# Patient Record
Sex: Male | Born: 1980 | Race: White | Hispanic: No | Marital: Married | Smoking: Former smoker
Health system: Southern US, Community
[De-identification: ages and names within clinical notes are randomized; demographics above are authoritative.]

## PROBLEM LIST (undated history)

## (undated) HISTORY — PX: MOUTH SURGERY: SHX715

---

## 2017-05-12 ENCOUNTER — Ambulatory Visit (INDEPENDENT_AMBULATORY_CARE_PROVIDER_SITE_OTHER): Admitting: Physician Assistant

## 2017-05-12 ENCOUNTER — Encounter: Payer: Self-pay | Admitting: Physician Assistant

## 2017-05-12 VITALS — BP 128/76 | HR 64 | Temp 98.7°F | Resp 16 | Ht 67.0 in | Wt 177.0 lb

## 2017-05-12 DIAGNOSIS — Z0000 Encounter for general adult medical examination without abnormal findings: Secondary | ICD-10-CM | POA: Diagnosis not present

## 2017-05-12 DIAGNOSIS — Z720 Tobacco use: Secondary | ICD-10-CM

## 2017-05-12 DIAGNOSIS — Z1329 Encounter for screening for other suspected endocrine disorder: Secondary | ICD-10-CM

## 2017-05-12 DIAGNOSIS — Z131 Encounter for screening for diabetes mellitus: Secondary | ICD-10-CM

## 2017-05-12 DIAGNOSIS — Z13220 Encounter for screening for lipoid disorders: Secondary | ICD-10-CM

## 2017-05-12 MED ORDER — BUPROPION HCL ER (SR) 150 MG PO TB12: ORAL_TABLET | ORAL | 0 refills | Status: DC

## 2017-05-12 NOTE — Patient Instructions (Signed)

## 2017-05-12 NOTE — Progress Notes (Signed)
Patient: Ryan Dudley Male    DOB: [birth year]   36 y.o.   MRN: [id number] Visit Date: 05/12/2017  Today's Provider: Trinna Post, PA-C   Chief Complaint  Patient presents with  . Establish Care   Subjective:    Ryan Dudley is a 36 y/o man presenting today to establish care. He was previously seen by [employer] as he is active duty in [employer]. 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   He was previously a Engineer, mining, met his wife who is also Engineer, mining in theatre in [place]. Wife currently works for [employer]. They have three children: Ryan Dudley, age 104 with achondroplasia; Ryan Dudley age 68, and the youngest boy, age 68.  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 He has also been deployed to [place]. He thinks he may have PTSD, but is able to cope with this. He reports a history of substance abuse. He has been participating in [place] since 2009 and has been sober since July 2017.  He is currently smoking a pack a day and also using dip. Had previously quit for four months in the past with Chantix, though it began to cause stomach pain and so he stopped. Ready to quit again. Has not had success with the patch in the past.  He is UTD on vaccinations.  Nicotine Dependence  Presents for initial visit. Preferred tobacco types include cigarettes. His urge triggers include company of smokers. Risk factors do not include contact with substance, decrease in perceived risk, disruptive behavior, drinking alcohol, drinking coffee, driving, meal time, perceived media message about smoking or stress.He smokes 1 pack of cigarettes per day. Sigmund is ready to quit.       Allergies  Allergen Reactions  . Ceclor [Cefaclor]      Current Outpatient Prescriptions:  .  AFLURIA QUADRIVALENT 0.5 ML injection, ADM 0.5ML IM  UTD, Disp: , Rfl: 0 .  buPROPion (WELLBUTRIN SR) 150 MG 12 hr tablet, Take on pill daily for three days. On day four, take one pill twice daily for remained of treatment., Disp: 180 tablet, Rfl: 0  Review of Systems  Constitutional: Negative.   HENT: Negative.   Eyes: Negative.   Respiratory: Negative.   Cardiovascular: Negative.   Gastrointestinal: Negative.   Endocrine: Negative.   Genitourinary: Negative.   Musculoskeletal: Negative.   Skin: Positive for rash. Negative for color change, pallor and wound.  Allergic/Immunologic: Positive for environmental allergies.  Neurological: Negative.   Hematological: Negative.   Psychiatric/Behavioral: Negative.    Family History  Problem Relation Age of Onset  . Adopted: Yes  . Pneumonia Mother    Past Surgical History:  Procedure Laterality Date  . MOUTH SURGERY      Social History  Substance Use Topics  . Smoking status: Current Every Day Smoker    Packs/day: 0.75    Types: Cigarettes  . Smokeless tobacco: Current User  . Alcohol use No   Objective:   BP 128/76 (BP Location: Left Arm, Patient Position: Sitting, Cuff Size: Large)   Pulse 64   Temp 98.7 F (37.1 C) (Oral)   Resp 16   Ht '5\' 7"'$  (1.702 m)   Wt 177 lb (80.3 kg)   BMI 27.72 kg/m  Vitals:   05/12/17 0913  BP: 128/76  Pulse: 64  Resp: 16  Temp: 98.7 F (37.1 C)  TempSrc: Oral  Weight: 177 lb (80.3 kg)  Height: '5\' 7"'$  (1.702 m)     Physical Exam  Constitutional: He is oriented to person, place, and time. He appears well-developed and well-nourished.  HENT:  Right Ear: Tympanic membrane and external ear normal.  Left Ear: Tympanic membrane and external ear normal.  Mouth/Throat: Oropharynx is clear and moist. No oral lesions. No oropharyngeal exudate.  Eyes: Conjunctivae are normal.  Neck: Neck supple.  Cardiovascular: Normal rate and regular rhythm.   Pulmonary/Chest: Effort normal and breath sounds normal.  Abdominal: Soft. Bowel sounds are  normal.  Lymphadenopathy:    He has no cervical adenopathy.  Neurological: He is alert and oriented to person, place, and time.  Skin: Skin is warm and dry.  Psychiatric: He has a normal mood and affect. His behavior is normal.   Depression screen [facility] 2/9 05/12/2017  Decreased Interest 0  Down, Depressed, Hopeless 0  PHQ - 2 Score 0  Altered sleeping 0  Tired, decreased energy 0  Change in appetite 0  Feeling bad or failure about yourself  0  Trouble concentrating 0  Moving slowly or fidgety/restless 0  Suicidal thoughts 0  PHQ-9 Score 0  Difficult doing work/chores Not difficult at all        Assessment & Plan:     1. Annual physical exam  - CBC with Differential  2. Tobacco abuse  - buPROPion (WELLBUTRIN SR) 150 MG 12 hr tablet; Take on pill daily for three days. On day four, take one pill twice daily for remained of treatment.  Dispense: 180 tablet; Refill: 0  3. Thyroid disorder  - TSH  4. Screening cholesterol level  - Lipid Profile  5. Diabetes mellitus screening  - Comprehensive Metabolic Panel (CMET)  Return in about 1 year (around 05/12/2018).  The entirety of the information documented in the History of Present Illness, Review of Systems and Physical Exam were personally obtained by me. Portions of this information were initially documented by Ashley Royalty, CMA and reviewed by me for thoroughness and accuracy.        Trinna Post, PA-C  [facility]

## 2017-07-05 ENCOUNTER — Ambulatory Visit (INDEPENDENT_AMBULATORY_CARE_PROVIDER_SITE_OTHER): Admitting: Physician Assistant

## 2017-07-05 ENCOUNTER — Encounter: Payer: Self-pay | Admitting: Physician Assistant

## 2017-07-05 ENCOUNTER — Telehealth: Payer: Self-pay | Admitting: Physician Assistant

## 2017-07-05 VITALS — BP 128/84 | HR 64 | Temp 98.4°F | Resp 16 | Wt 173.0 lb

## 2017-07-05 DIAGNOSIS — J40 Bronchitis, not specified as acute or chronic: Secondary | ICD-10-CM

## 2017-07-05 DIAGNOSIS — L858 Other specified epidermal thickening: Secondary | ICD-10-CM

## 2017-07-05 DIAGNOSIS — Z720 Tobacco use: Secondary | ICD-10-CM | POA: Diagnosis not present

## 2017-07-05 LAB — CBC WITH DIFFERENTIAL/PLATELET
Basophils Absolute: 39 cells/uL (ref 0–200)
Basophils Relative: 0.7 %
Eosinophils Absolute: 171 cells/uL (ref 15–500)
Eosinophils Relative: 3.1 %
HCT: 44.4 % (ref 38.5–50.0)
Hemoglobin: 15.5 g/dL (ref 13.2–17.1)
Lymphs Abs: 1925 cells/uL (ref 850–3900)
MCH: 31.1 pg (ref 27.0–33.0)
MCHC: 34.9 g/dL (ref 32.0–36.0)
MCV: 89.2 fL (ref 80.0–100.0)
MPV: 9.1 fL (ref 7.5–12.5)
Monocytes Relative: 11.7 %
Neutro Abs: 2723 cells/uL (ref 1500–7800)
Neutrophils Relative %: 49.5 %
Platelets: 256 10*3/uL (ref 140–400)
RBC: 4.98 10*6/uL (ref 4.20–5.80)
RDW: 12.6 % (ref 11.0–15.0)
Total Lymphocyte: 35 %
WBC mixed population: 644 cells/uL (ref 200–950)
WBC: 5.5 10*3/uL (ref 3.8–10.8)

## 2017-07-05 LAB — COMPLETE METABOLIC PANEL WITH GFR
AG Ratio: 2 (calc) (ref 1.0–2.5)
ALT: 22 U/L (ref 9–46)
AST: 23 U/L (ref 10–40)
Albumin: 4.3 g/dL (ref 3.6–5.1)
Alkaline phosphatase (APISO): 64 U/L (ref 40–115)
BUN: 17 mg/dL (ref 7–25)
CO2: 30 mmol/L (ref 20–32)
Calcium: 9.5 mg/dL (ref 8.6–10.3)
Chloride: 103 mmol/L (ref 98–110)
Creat: 1.02 mg/dL (ref 0.60–1.35)
GFR, Est African American: 111 mL/min/{1.73_m2} (ref >=60)
GFR, Est Non African American: 95 mL/min/{1.73_m2} (ref >=60)
Globulin: 2.1 g/dL (calc) (ref 1.9–3.7)
Glucose, Bld: 90 mg/dL (ref 65–99)
Potassium: 4 mmol/L (ref 3.5–5.3)
Sodium: 140 mmol/L (ref 135–146)
Total Bilirubin: 0.9 mg/dL (ref 0.2–1.2)
Total Protein: 6.4 g/dL (ref 6.1–8.1)

## 2017-07-05 LAB — TSH: TSH: 1.59 mIU/L (ref 0.40–4.50)

## 2017-07-05 LAB — LIPID PANEL
Cholesterol: 146 mg/dL (ref <200)
HDL: 51 mg/dL (ref >40)
LDL Cholesterol (Calc): 76 mg/dL (calc)
Non-HDL Cholesterol (Calc): 95 mg/dL (calc) (ref <130)
Total CHOL/HDL Ratio: 2.9 (calc) (ref <5.0)
Triglycerides: 104 mg/dL (ref <150)

## 2017-07-05 MED ORDER — VARENICLINE TARTRATE 1 MG PO TABS: 1.0000 mg | ORAL_TABLET | Freq: Two times a day (BID) | ORAL | 1 refills | Status: AC

## 2017-07-05 MED ORDER — VARENICLINE TARTRATE 0.5 MG X 11 & 1 MG X 42 PO MISC: ORAL | 0 refills | Status: DC

## 2017-07-05 NOTE — Progress Notes (Signed)
Patient: Ryan Dudley Male    DOB: [birth year]   36 y.o.   MRN: [id number] Visit Date: 07/05/2017  Today's Provider: Trey SailorsAdriana M Pollak, PA-C   Chief Complaint  Patient presents with  . Hospitalization Follow-up  . Bronchitis   Subjective:  Ryan MedicoDavid Dudley is a 36 y/o man who currently spokes 3/4 packs per day who is following up for recent hospitalization for bronchitis. He was traveling in [place] for his work in [employer] when he became very ill during his training. He says he presented to the ER where his lactic acid was elevated. He reports he had blood cultures drawn which grew staph, but he says this was just a contaminant. He reports he had IV medications that might have been vancomycin. He reports his CXR was negative for pneumonia.  He was discharged per his report on Cefdinir 300 mg BID x 3 days. He has taken Cefdinir 300 mg QD x 3 days. He reports feeling better. He reports somewhat of a lingering cough. Denies fevers, chills, nausea, vomiting, confusion. He does not bring any records with him today.  He also reports that he tried Wellbutrin for smoking cessation, which did not work. He would like to try Chantix again.  Also reporting small bumps on arms. He has tried moisturizing and salicylic acid wash with some but not complete improvement.      Follow up Hospitalization  Patient was admitted to  on 06/28/2017 and discharged on 06/30/2017. He was treated for Bronchitis and Staph Infection. Treatment for this included IV Antibiotics and Cefdinir. He reports excellent compliance with treatment. He reports this condition is Improved.  ------------------------------------------------------------------------------------     Cough  This is a new problem. The problem has been rapidly improving. Pertinent negatives include no chest pain, chills, ear congestion, ear pain, fever, headaches, heartburn, hemoptysis, myalgias, nasal congestion, postnasal drip, rash,  rhinorrhea, sore throat, shortness of breath, sweats, weight loss or wheezing.       Allergies  Allergen Reactions  . Ceclor [Cefaclor]      Current Outpatient Medications:  .  AFLURIA QUADRIVALENT 0.5 ML injection, ADM 0.5ML IM UTD, Disp: , Rfl: 0 .  buPROPion (WELLBUTRIN SR) 150 MG 12 hr tablet, Take on pill daily for three days. On day four, take one pill twice daily for remained of treatment. (Patient not taking: Reported on 07/05/2017), Disp: 180 tablet, Rfl: 0 .  cefdinir (OMNICEF) 300 MG capsule, TK ONE C PO  Q 12 H., Disp: , Rfl: 0  Review of Systems  Constitutional: Positive for fatigue. Negative for activity change, appetite change, chills, diaphoresis, fever, unexpected weight change and weight loss.  HENT: Negative.  Negative for ear pain, postnasal drip, rhinorrhea and sore throat.   Respiratory: Positive for cough. Negative for apnea, hemoptysis, choking, chest tightness, shortness of breath, wheezing and stridor.   Cardiovascular: Negative.  Negative for chest pain.  Gastrointestinal: Negative for heartburn.  Musculoskeletal: Negative for myalgias.  Skin: Negative for rash.  Neurological: Negative for dizziness, light-headedness and headaches.  Psychiatric/Behavioral: The patient is nervous/anxious.     Social History   Tobacco Use  . Smoking status: Current Every Day Smoker    Packs/day: 0.75    Types: Cigarettes  . Smokeless tobacco: Current User  Substance Use Topics  . Alcohol use: No   Objective:   BP 128/84 (BP Location: Left Arm, Patient Position: Sitting, Cuff Size: Normal)   Pulse 64   Temp 98.4 F (36.9 C) (  Oral)   Resp 16   Wt 173 lb (78.5 kg)   SpO2 95%   BMI 27.10 kg/m  Vitals:   07/05/17 0813  BP: 128/84  Pulse: 64  Resp: 16  Temp: 98.4 F (36.9 C)  TempSrc: Oral  SpO2: 95%  Weight: 173 lb (78.5 kg)     Physical Exam  Constitutional: He is oriented to person, place, and time. He appears well-developed and well-nourished.    HENT:  Right Ear: External ear normal.  Left Ear: External ear normal.  Mouth/Throat: Oropharynx is clear and moist. No oropharyngeal exudate.  Eyes: Conjunctivae are normal.  Cardiovascular: Normal rate and regular rhythm.  Pulmonary/Chest: Effort normal and breath sounds normal.  Neurological: He is alert and oriented to person, place, and time.  Skin: Skin is warm and dry.  Small pink papules scattered on posterior upper arms.  Psychiatric: He has a normal mood and affect. His behavior is normal.        Assessment & Plan:     1. Bronchitis  Do not have hospital records. I will request these. Does not sound like he had bacteremia as it may have been skin contaminant and his IV abx were discontinued. Did not take cefdinir as prescribed, however, he feels better. Exam benign today. Will get labwork ordered last visit.  2. Keratosis pilaris  - Ambulatory referral to Dermatology  3. Tobacco abuse  I have spent at least 3 minutes counseling patient on smoking cessation. We will stop Wellbutrin and start Chantix.  - varenicline (CHANTIX STARTING MONTH PAK) 0.5 MG X 11 & 1 MG X 42 tablet; One 0.5 mg tablet 1x daily for 3 days, increase to one 0.5 mg tablet 2x daily for 4 day, increase to one 1 mg tablet 2x daily.  Dispense: 53 tablet; Refill: 0 - varenicline (CHANTIX CONTINUING MONTH PAK) 1 MG tablet; Take 1 tablet (1 mg total) by mouth 2 (two) times daily.  Dispense: 60 tablet; Refill: 1  Return in about 1 year (around 07/05/2018) for CPE.  The entirety of the information documented in the History of Present Illness, Review of Systems and Physical Exam were personally obtained by me. Portions of this information were initially documented by Kavin LeechLaura Walsh, CMA and reviewed by me for thoroughness and accuracy.        Trey SailorsAdriana M Pollak, PA-C  [facility]

## 2017-07-05 NOTE — Telephone Encounter (Signed)
We can't fax private info (social security #) to unsecured fax, would need patient to sign ROI. Does he have a name of the mail order company? We may be able to send the prescription online without his social if he provides that.

## 2017-07-05 NOTE — Patient Instructions (Signed)

## 2017-07-05 NOTE — Telephone Encounter (Signed)
Pt is requesting a new Rx written and faxed to mail order at fax #[phone] so pt can get it at a lower cost.  Pt states to include Social Security #[id number].  varenicline (CHANTIX STARTING MONTH PAK) 0.5 MG X 11 & 1 MG X 42 tablet   varenicline (CHANTIX CONTINUING MONTH PAK) 1 MG tablet

## 2017-07-07 ENCOUNTER — Telehealth: Payer: Self-pay

## 2017-07-07 NOTE — Telephone Encounter (Signed)
LMTCB 07/07/2017  Thanks,   -Adylee Leonardo  

## 2017-07-07 NOTE — Telephone Encounter (Signed)
-----   Message from Trey SailorsAdriana M Pollak, [place]PA-C sent at 07/06/2017  8:41 AM EST ----- Cholesterol normal, TSH normal. CBC with no signs of infection or anemia. CMET with normal liver and kidney function, normal electrolytes.

## 2017-07-07 NOTE — Telephone Encounter (Signed)
Advised  ED 

## 2017-07-07 NOTE — Telephone Encounter (Signed)
Advised  He will be in touch with us on how he wants this handled.  ED

## 2017-07-07 NOTE — Telephone Encounter (Signed)
LMTCB 07/07/2017  Thanks,   -Matraca Hunkins  

## 2017-08-03 ENCOUNTER — Telehealth: Payer: Self-pay

## 2017-08-03 NOTE — Telephone Encounter (Signed)
Noted, thank you

## 2017-08-03 NOTE — Telephone Encounter (Signed)
Patient report that when he runs he feels like his heart skips a beat. He feels it more when he takes deep breaths.Reports this has happened before and but now is happening more with exercising and running. Feels that is keeping him from getting to where he needs to get with the training he needs to keep up with. [place] [employer] [employer] [place]. Reports that since he notice the heart palpitations for the past months he stop "smoking" and he is down to two caffeine drinks, He reports that he did drink a monster energy drink yesterday. Reports No SOB or chest pressure/pain. No palpitations when at rest. Patient requested an appointment for January 30th. Patient was scheduled 08/17/17 at 8:00  Thanks,  -Joseline

## 2017-08-17 ENCOUNTER — Ambulatory Visit (INDEPENDENT_AMBULATORY_CARE_PROVIDER_SITE_OTHER): Admitting: Physician Assistant

## 2017-08-17 ENCOUNTER — Encounter: Payer: Self-pay | Admitting: Physician Assistant

## 2017-08-17 VITALS — BP 122/78 | HR 68 | Temp 98.4°F | Resp 16 | Wt 170.0 lb

## 2017-08-17 DIAGNOSIS — R002 Palpitations: Secondary | ICD-10-CM

## 2017-08-17 NOTE — Patient Instructions (Signed)
Palpitations A palpitation is the feeling that your heart:  Has an uneven (irregular) heartbeat.  Is beating faster than normal.  Is fluttering.  Is skipping a beat.  This is usually not a serious problem. In some cases, you may need more medical tests. Follow these instructions at home:  Avoid: ? Caffeine in coffee, tea, soft drinks, diet pills, and energy drinks. ? Chocolate. ? Alcohol.  Do not use any tobacco products. These include cigarettes, chewing tobacco, and e-cigarettes. If you need help quitting, ask your doctor.  Try to reduce your stress. These things may help: ? Yoga. ? Meditation. ? Physical activity. Swimming, jogging, and walking are good choices. ? A method that helps you use your mind to control things in your body, like heartbeats (biofeedback).  Get plenty of rest and sleep.  Take over-the-counter and prescription medicines only as told by your doctor.  Keep all follow-up visits as told by your doctor. This is important. Contact a doctor if:  Your heartbeat is still fast or uneven after 24 hours.  Your palpitations occur more often. Get help right away if:  You have chest pain.  You feel short of breath.  You have a very bad headache.  You feel dizzy.  You pass out (faint). This information is not intended to replace advice given to you by your health care provider. Make sure you discuss any questions you have with your health care provider. Document Released: 04/13/2008 Document Revised: 12/11/2015 Document Reviewed: 03/20/2015 Elsevier Interactive Patient Education  2018 Elsevier Inc.  

## 2017-08-17 NOTE — Progress Notes (Signed)
Patient: Ryan Dudley Male    DOB: [birth year]   37 y.o.   MRN: [id number] Visit Date: 08/17/2017  Today's Provider: Trey Sailors, PA-C   Chief Complaint  Patient presents with  . Palpitations   Subjective:    Ryan Dudley is a 37 y/o man with PMH significant for history of smoking, using dip who presents today for several months of palpitations. He reports he has had several episodes in the past but they have been more frequent recently. He was on treadmill running, exeprienced heart skipping a beat on the treadmill. He reduced speed at this time, tried to relax and symptoms reduced. Symptoms returned with increase in speed. No sharp chest pains, has happened before but previously attributed this to caffeine.  cutting caffeine to 1-2 cups coffee daily has reduced caffeine intake from energy drinks. Happened again when running up stairs. Does not know much about his family history. Denies anxiety.    Palpitations   This is a new problem. The problem has been unchanged (Only with running, climbing stairs. ). The symptoms are aggravated by exercise. Associated symptoms include an irregular heartbeat. Pertinent negatives include no anxiety, chest fullness, chest pain, coughing, diaphoresis, dizziness, fever, malaise/fatigue, nausea, near-syncope, numbness, shortness of breath, syncope, vomiting or weakness.       Allergies  Allergen Reactions  . Ceclor [Cefaclor]      Current Outpatient Medications:  .  varenicline (CHANTIX CONTINUING MONTH PAK) 1 MG tablet, Take 1 tablet (1 mg total) by mouth 2 (two) times daily., Disp: 60 tablet, Rfl: 1 .  AFLURIA QUADRIVALENT 0.5 ML injection, ADM 0.5ML IM UTD, Disp: , Rfl: 0 .  cefdinir (OMNICEF) 300 MG capsule, TK ONE C PO  Q 12 H., Disp: , Rfl: 0  Review of Systems  Constitutional: Negative.  Negative for diaphoresis, fever and malaise/fatigue.  Respiratory: Negative.  Negative for cough and shortness of breath.   Cardiovascular:  Positive for palpitations. Negative for chest pain, leg swelling, syncope and near-syncope.  Gastrointestinal: Negative.  Negative for nausea and vomiting.  Neurological: Negative for dizziness, weakness, numbness and headaches.  Psychiatric/Behavioral: The patient is not nervous/anxious.     Social History   Tobacco Use  . Smoking status: Former Smoker    Packs/day: 0.75    Types: Cigarettes    Last attempt to quit: 05/19/2017    Years since quitting: 0.2  . Smokeless tobacco: Current User  Substance Use Topics  . Alcohol use: No   Objective:   BP 122/78 (BP Location: Right Arm, Patient Position: Sitting, Cuff Size: Normal)   Pulse 68   Temp 98.4 F (36.9 C) (Oral)   Resp 16   Wt 170 lb (77.1 kg)   BMI 26.63 kg/m  Vitals:   08/17/17 0809  BP: 122/78  Pulse: 68  Resp: 16  Temp: 98.4 F (36.9 C)  TempSrc: Oral  Weight: 170 lb (77.1 kg)     Physical Exam  Constitutional: He is oriented to person, place, and time. He appears well-developed and well-nourished.  Neck: Normal carotid pulses present. Carotid bruit is not present.  Cardiovascular: Normal rate, regular rhythm and normal heart sounds.  Pulmonary/Chest: Effort normal and breath sounds normal.  Neurological: He is alert and oriented to person, place, and time.  Skin: Skin is warm and dry.  Psychiatric: He has a normal mood and affect. His behavior is normal.        Assessment & Plan:  1. Heart palpitations  Have been happening for a while, noticeable during exertion. Former smoker, still uses dip. Former history of alcohol abuse, currently in remission. Since events are very sporadic, I do not think we would capture them on an EKG in office. For further evaluation and reassurance, recommend cardiology referral for event monitor.  - Ambulatory referral to Cardiology  The entirety of the information documented in the History of Present Illness, Review of Systems and Physical Exam were personally obtained  by me. Portions of this information were initially documented by Kavin LeechLaura Walsh, CMA and reviewed by me for thoroughness and accuracy.   Return if symptoms worsen or fail to improve.  I have spent 15 minutes with this patient, >50% of which was spent on counseling and coordination of care.      Trey SailorsAdriana M Pollak, PA-C  [facility]

## 2017-08-19 DIAGNOSIS — R002 Palpitations: Secondary | ICD-10-CM | POA: Insufficient documentation

## 2017-08-19 DIAGNOSIS — R001 Bradycardia, unspecified: Secondary | ICD-10-CM | POA: Insufficient documentation

## 2017-10-26 ENCOUNTER — Telehealth: Payer: Self-pay | Admitting: Physician Assistant

## 2017-10-26 NOTE — Telephone Encounter (Signed)
Faxed [facility] on 12.19.18

## 2017-11-01 ENCOUNTER — Other Ambulatory Visit: Payer: Self-pay | Admitting: Physician Assistant

## 2017-11-01 DIAGNOSIS — Z720 Tobacco use: Secondary | ICD-10-CM

## 2017-11-01 MED ORDER — BUPROPION HCL ER (SR) 150 MG PO TB12: ORAL_TABLET | ORAL | 1 refills | Status: DC

## 2017-11-01 NOTE — Telephone Encounter (Signed)
Pt contacted office for refill request on the following medications:  buPROPion (WELLBUTRIN SR) 150 MG 12 hr tablet  [facility] [address]  Last Rx: 05/12/17 LOV: 08/17/17 Please advise. Thanks TNP

## 2017-11-01 NOTE — Telephone Encounter (Signed)
Wellbutrin sent.

## 2017-12-14 ENCOUNTER — Other Ambulatory Visit: Payer: Self-pay | Admitting: Physician Assistant

## 2017-12-14 DIAGNOSIS — Z720 Tobacco use: Secondary | ICD-10-CM

## 2018-03-07 ENCOUNTER — Ambulatory Visit (INDEPENDENT_AMBULATORY_CARE_PROVIDER_SITE_OTHER): Admitting: Family Medicine

## 2018-03-07 ENCOUNTER — Encounter: Payer: Self-pay | Admitting: Family Medicine

## 2018-03-07 VITALS — BP 130/88 | HR 70 | Temp 98.3°F | Resp 16 | Wt 183.0 lb

## 2018-03-07 DIAGNOSIS — J069 Acute upper respiratory infection, unspecified: Secondary | ICD-10-CM | POA: Diagnosis not present

## 2018-03-07 MED ORDER — FLUTICASONE PROPIONATE 50 MCG/ACT NA SUSP: 2.0000 | Freq: Every day | NASAL | 6 refills | Status: DC

## 2018-03-07 NOTE — Patient Instructions (Signed)
Upper Respiratory Infection, Adult Most upper respiratory infections (URIs) are caused by a virus. A URI affects the nose, throat, and upper air passages. The most common type of URI is often called "the common cold." Follow these instructions at home:  Take medicines only as told by your doctor.  Gargle warm saltwater or take cough drops to comfort your throat as told by your doctor.  Use a warm mist humidifier or inhale steam from a shower to increase air moisture. This may make it easier to breathe.  Drink enough fluid to keep your pee (urine) clear or pale yellow.  Eat soups and other clear broths.  Have a healthy diet.  Rest as needed.  Go back to work when your fever is gone or your doctor says it is okay. ? You may need to stay home longer to avoid giving your URI to others. ? You can also wear a face mask and wash your hands often to prevent spread of the virus.  Use your inhaler more if you have asthma.  Do not use any tobacco products, including cigarettes, chewing tobacco, or electronic cigarettes. If you need help quitting, ask your doctor. Contact a doctor if:  You are getting worse, not better.  Your symptoms are not helped by medicine.  You have chills.  You are getting more short of breath.  You have brown or red mucus.  You have yellow or brown discharge from your nose.  You have pain in your face, especially when you bend forward.  You have a fever.  You have puffy (swollen) neck glands.  You have pain while swallowing.  You have white areas in the back of your throat. Get help right away if:  You have very bad or constant: ? Headache. ? Ear pain. ? Pain in your forehead, behind your eyes, and over your cheekbones (sinus pain). ? Chest pain.  You have long-lasting (chronic) lung disease and any of the following: ? Wheezing. ? Long-lasting cough. ? Coughing up blood. ? A change in your usual mucus.  You have a stiff neck.  You have  changes in your: ? Vision. ? Hearing. ? Thinking. ? Mood. This information is not intended to replace advice given to you by your health care provider. Make sure you discuss any questions you have with your health care provider. Document Released: 12/22/2007 Document Revised: 03/07/2016 Document Reviewed: 10/10/2013 Elsevier Interactive Patient Education  2018 Elsevier Inc.  

## 2018-03-07 NOTE — Progress Notes (Signed)
Patient: Ryan MedicoDavid Troiani Male    DOB: June 17, 1981   37 y.o.   MRN: [id number] Visit Date: 03/07/2018  Today's Provider: Shirlee LatchAngela Bacigalupo, MD   I, Joslyn HyEmily Ratchford, CMA, am acting as scribe for Shirlee LatchAngela Bacigalupo, MD.  Chief Complaint  Patient presents with  . URI   Subjective:    URI   This is a new problem. The current episode started in the past 7 days. The problem has been gradually worsening. There has been no fever. Associated symptoms include chest pain (from coughing), congestion, coughing (somewhat productive), headaches, neck pain, a plugged ear sensation, rhinorrhea, sinus pain, sneezing and a sore throat (in the mornings). Pertinent negatives include no abdominal pain, diarrhea, dysuria, ear pain, nausea, swollen glands, vomiting or wheezing. He has tried antihistamine, decongestant and sleep for the symptoms. The treatment provided no relief (sleep helps more than OTC medications).   5 days duration.  Feels like an allergy attack.  Tried Zyrtec, Claritin, decongestant and honey for cough.      Allergies  Allergen Reactions  . Ceclor [Cefaclor]     States he was administered this while hospitalized without reaction.     Current Outpatient Medications:  .  buPROPion (WELLBUTRIN SR) 150 MG 12 hr tablet, TAKE 1 TABLET BY MOUTH DAILY FOR 3 DAYS, THEN INCREASE TO 1 TABLET TWICE DAILY AS DIRECTED, Disp: 180 tablet, Rfl: 1  Review of Systems  HENT: Positive for congestion, rhinorrhea, sinus pain, sneezing and sore throat (in the mornings). Negative for ear pain.   Respiratory: Positive for cough (somewhat productive). Negative for wheezing.   Cardiovascular: Positive for chest pain (from coughing).  Gastrointestinal: Negative for abdominal pain, diarrhea, nausea and vomiting.  Genitourinary: Negative for dysuria.  Musculoskeletal: Positive for neck pain.  Neurological: Positive for headaches.    Social History   Tobacco Use  . Smoking status: Former Smoker   Packs/day: 0.75    Types: Cigarettes    Last attempt to quit: 05/19/2017    Years since quitting: 0.8  . Smokeless tobacco: Former Engineer, waterUser  Substance Use Topics  . Alcohol use: No   Objective:   BP 130/88 (BP Location: Left Arm, Patient Position: Sitting, Cuff Size: Normal)   Pulse 70   Temp 98.3 F (36.8 C) (Oral)   Resp 16   Wt 183 lb (83 kg)   SpO2 98%   BMI 28.66 kg/m  Vitals:   03/07/18 0844  BP: 130/88  Pulse: 70  Resp: 16  Temp: 98.3 F (36.8 C)  TempSrc: Oral  SpO2: 98%  Weight: 183 lb (83 kg)     Physical Exam  Constitutional: He is oriented to person, place, and time. He appears well-developed and well-nourished. No distress.  HENT:  Head: Normocephalic and atraumatic.  Right Ear: Tympanic membrane, external ear and ear canal normal.  Left Ear: Tympanic membrane, external ear and ear canal normal.  Nose: Mucosal edema and rhinorrhea present. Right sinus exhibits no maxillary sinus tenderness and no frontal sinus tenderness. Left sinus exhibits no maxillary sinus tenderness and no frontal sinus tenderness.  Mouth/Throat: Oropharynx is clear and moist. No oropharyngeal exudate.  Eyes: Pupils are equal, round, and reactive to light. Conjunctivae are normal. No scleral icterus.  Neck: Neck supple. No thyromegaly present.  Cardiovascular: Normal rate, regular rhythm, normal heart sounds and intact distal pulses.  No murmur heard. Pulmonary/Chest: Effort normal and breath sounds normal. No respiratory distress. He has no wheezes. He has no rales.  Abdominal:  Soft. He exhibits no distension. There is no tenderness.  Musculoskeletal: He exhibits no edema.  Lymphadenopathy:    He has no cervical adenopathy.  Neurological: He is alert and oriented to person, place, and time.  Skin: Skin is warm and dry. Capillary refill takes less than 2 seconds. No rash noted.  Psychiatric: He has a normal mood and affect. His behavior is normal.  Vitals reviewed.       Assessment  & Plan:   1. Viral URI - symptoms and exam c/w viral URI - no evidence of strep pharyngitis, CAP, AOM, bacterial sinusitis, or other bacterial infection - discussed symptomatic management, natural course, and return precautions   Meds ordered this encounter  Medications  . fluticasone (FLONASE) 50 MCG/ACT nasal spray    Sig: Place 2 sprays into both nostrils daily.    Dispense:  16 g    Refill:  6     Return if symptoms worsen or fail to improve.   The entirety of the information documented in the History of Present Illness, Review of Systems and Physical Exam were personally obtained by me. Portions of this information were initially documented by Irving BurtonEmily Ratchford, CMA and reviewed by me for thoroughness and accuracy.    Erasmo DownerBacigalupo, Angela M, MD, MPH [facility] 03/07/2018 9:06 AM

## 2018-04-03 ENCOUNTER — Ambulatory Visit (INDEPENDENT_AMBULATORY_CARE_PROVIDER_SITE_OTHER): Admitting: Family Medicine

## 2018-04-03 ENCOUNTER — Encounter: Payer: Self-pay | Admitting: Family Medicine

## 2018-04-03 ENCOUNTER — Ambulatory Visit
Admission: RE | Admit: 2018-04-03 | Discharge: 2018-04-03 | Disposition: A | Discharge status: Home / Self Care | Source: Ambulatory Visit | Attending: Family Medicine | Admitting: Family Medicine

## 2018-04-03 VITALS — BP 128/70 | HR 56 | Temp 98.6°F | Resp 16 | Wt 184.0 lb

## 2018-04-03 DIAGNOSIS — M545 Low back pain, unspecified: Secondary | ICD-10-CM

## 2018-04-03 MED ORDER — MELOXICAM 15 MG PO TABS: 15.0000 mg | ORAL_TABLET | Freq: Every day | ORAL | 0 refills | Status: DC

## 2018-04-03 NOTE — Progress Notes (Signed)
  Subjective:     Patient ID: Ryan MedicoDavid Dudley, male   DOB: 12/21/1980, 37 y.o.   MRN: [id number] Chief Complaint  Patient presents with  . Back Pain    Patient comes in office today with concerns of lower back pain for one month. Patient is on active duty and states that he works 3-5 days a week and states that he has pain when stretching, sitting for prolonged periods or when exercising.    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 He works out near daily with aerobic exercise and weights both machine and free. Reports situps tend to make his back hurt more. No radicular sx or prior hx of injury or surgery. States he is pending physical training.  Review of Systems     Objective:   Physical Exam  Constitutional: He appears well-developed and well-nourished. No distress.  Musculoskeletal:  Muscle strength in lower extremities 5/5. SLR's to 90 degrees without radiation of back pain. Localizes to his lower lumbar spine. Non-tender to palpation.       Assessment:    1. Midline low back pain without sciatica, unspecified chronicity: start meloxicam - DG Lumbar Spine Complete; Future    Plan:    Further f/u pending x-ray report.

## 2018-04-03 NOTE — Patient Instructions (Signed)
We will call you with the x-ray results. Consider cross training to avoid activity that increased stress on your back.

## 2018-05-08 ENCOUNTER — Ambulatory Visit (INDEPENDENT_AMBULATORY_CARE_PROVIDER_SITE_OTHER): Admitting: Physician Assistant

## 2018-05-08 ENCOUNTER — Encounter: Payer: Self-pay | Admitting: Physician Assistant

## 2018-05-08 VITALS — BP 134/92 | HR 80 | Temp 97.8°F | Resp 18 | Wt 182.2 lb

## 2018-05-08 DIAGNOSIS — F419 Anxiety disorder, unspecified: Secondary | ICD-10-CM | POA: Diagnosis not present

## 2018-05-08 DIAGNOSIS — M545 Low back pain, unspecified: Secondary | ICD-10-CM

## 2018-05-08 DIAGNOSIS — G8929 Other chronic pain: Secondary | ICD-10-CM

## 2018-05-08 NOTE — Patient Instructions (Signed)

## 2018-05-08 NOTE — Progress Notes (Signed)
Patient: Ryan Dudley Male    DOB: 05/12/1981   37 y.o.   MRN: [id number] Visit Date: 05/10/2018  Today's Provider: Trey Sailors, PA-C   Chief Complaint  Patient presents with  . Back Pain   Subjective:    HPI Back Pain  Patient presents today with lower back pain. Patient has been experiencing the pain for about 2-3 months now. Pain denies any other symptoms at this time. Was seen in this clinic 1 month ago by another provider and prescribed Meloxicam. Patient has been taking the Meloxicam and states it has not helped out any. Lumbar xray shows minimal L5-S1 disc space narrowing but otherwise normal. Reports it hurts in his low back, increases with bending over and sitting. Denies issues walking, weakness in legs, incontinence. Denies pain radiating down his legs. He drives 1.5 hours one way to [place] [facility] [facility] for work. Has decreased exercise recently.   Allergies  Allergen Reactions  . Ceclor [Cefaclor]     States he was administered this while hospitalized without reaction.     Current Outpatient Medications:  .  buPROPion (WELLBUTRIN SR) 150 MG 12 hr tablet, TAKE 1 TABLET BY MOUTH DAILY FOR 3 DAYS, THEN INCREASE TO 1 TABLET TWICE DAILY AS DIRECTED, Disp: 180 tablet, Rfl: 1 .  fluticasone (FLONASE) 50 MCG/ACT nasal spray, Place 2 sprays into both nostrils daily., Disp: 16 g, Rfl: 6 .  meloxicam (MOBIC) 15 MG tablet, Take 1 tablet (15 mg total) by mouth daily., Disp: 30 tablet, Rfl: 0  Review of Systems  Constitutional: Negative.   HENT: Negative.   Respiratory: Negative.   Cardiovascular: Negative.   Gastrointestinal: Negative.   Endocrine: Negative.   Musculoskeletal: Positive for back pain.  Neurological: Negative.     Social History   Tobacco Use  . Smoking status: Former Smoker    Packs/day: 0.75    Types: Cigarettes    Last attempt to quit: 05/19/2017    Years since quitting: 0.9  . Smokeless tobacco: Former Engineer, water Use Topics  . Alcohol use: No    Objective:   BP (!) 134/92 (BP Location: Left Arm, Patient Position: Sitting, Cuff Size: Normal)   Pulse 80   Temp 97.8 F (36.6 C) (Oral)   Resp 18   Wt 182 lb 3.2 oz (82.6 kg)   SpO2 97%   BMI 28.54 kg/m  Vitals:   05/08/18 1349  BP: (!) 134/92  Pulse: 80  Resp: 18  Temp: 97.8 F (36.6 C)  TempSrc: Oral  SpO2: 97%  Weight: 182 lb 3.2 oz (82.6 kg)     Physical Exam  Constitutional: He is oriented to person, place, and time. He appears well-developed and well-nourished.  Musculoskeletal: Normal range of motion. He exhibits no edema, tenderness or deformity.       Lumbar back: He exhibits pain. He exhibits no spasm.  Neurological: He is alert and oriented to person, place, and time.  Skin: Skin is warm and dry.  Psychiatric: He has a normal mood and affect. His behavior is normal.        Assessment & Plan:     1. Chronic midline low back pain without sciatica  Suspect muscular dysfunction - lumbar xray normal, no alarm features. Suspect physical therapy would help him.  - Ambulatory referral to Physical Therapy  2. Anxiety  He would like to try talking to counselor and I have made referral below.  - Ambulatory referral to Psychology  Return if  symptoms worsen or fail to improve.  The entirety of the information documented in the History of Present Illness, Review of Systems and Physical Exam were personally obtained by me. Portions of this information were initially documented by Rondel Baton, CMA and reviewed by me for thoroughness and accuracy.        Trey Sailors, PA-C  [facility]

## 2018-05-19 ENCOUNTER — Encounter: Payer: Self-pay | Admitting: Physical Therapy

## 2018-05-19 ENCOUNTER — Ambulatory Visit: Attending: Physician Assistant | Admitting: Physical Therapy

## 2018-05-19 DIAGNOSIS — G8929 Other chronic pain: Secondary | ICD-10-CM | POA: Diagnosis present

## 2018-05-19 DIAGNOSIS — M545 Low back pain, unspecified: Secondary | ICD-10-CM

## 2018-05-19 NOTE — Therapy (Signed)
[place] [facility] [address] [place], [address] Phone: [phone]   Fax:  [phone]  Physical Therapy Treatment  Patient Details  Name: Ryan Dudley MRN: [id number] Date of Birth: May 20, 1981 No data recorded  Encounter Date: 05/19/2018    No past medical history on file.  Past Surgical History:  Procedure Laterality Date  . MOUTH SURGERY      There were no vitals filed for this visit.      ROM All lumbar and hip motions wnl with pain with  active lumbar flexion (concordant sign) and ext. Pain with combined FABER motion (R). Mild hypermobility at lumbar spinous processes  Strength Hip flex: 5/5 bilat Hip abd 5/5 bilat Hip Ext in prone: 5/5 bilat Hip IR: 5/5 bilat Hip ER: 5/5     Special Tests/ Other Reflexes 2+ bilat patellar reflex (-) slump bilat (-) SLR (-) Crossed SLR (-) Elys bilat (-) 90/90 bilat (+) FABER on R (-) Obers bilat (-) Scour (-) SI cluster Pain reduction with L3-5 CPA with noted good mobility/mild hypermobility Inc pain with repeated active lumbar flex and ext; min pain reduction with prone prop   Ther-Ex -Postural education with prolonged driving/sitting (lumbar support to accommodate natural lumbar curvature.  - Child's pose 30sec hold with  bil lateral bias as well for lumbar paraspinal stretch                    Visit Diagnosis: No diagnosis found.     Problem List Patient Active Problem List   Diagnosis Date Noted  . Tobacco abuse 05/12/2017    Staci Acosta 05/19/2018, 8:20 AM  [place] [facility] [address] [place], [address] Phone: [phone]   Fax:  [phone]  Name: Ryan Dudley MRN: [id number] Date of Birth: 12-03-1980

## 2018-05-23 ENCOUNTER — Ambulatory Visit: Admitting: Physical Therapy

## 2018-05-25 ENCOUNTER — Ambulatory Visit: Admitting: Physical Therapy

## 2018-05-29 ENCOUNTER — Ambulatory Visit (INDEPENDENT_AMBULATORY_CARE_PROVIDER_SITE_OTHER): Admitting: Psychology

## 2018-05-29 DIAGNOSIS — F418 Other specified anxiety disorders: Secondary | ICD-10-CM

## 2018-05-31 ENCOUNTER — Encounter: Admitting: Physical Therapy

## 2018-05-31 ENCOUNTER — Ambulatory Visit: Admitting: Physical Therapy

## 2018-06-05 ENCOUNTER — Ambulatory Visit (INDEPENDENT_AMBULATORY_CARE_PROVIDER_SITE_OTHER): Admitting: Psychology

## 2018-06-05 ENCOUNTER — Ambulatory Visit: Admitting: Physical Therapy

## 2018-06-05 ENCOUNTER — Encounter: Payer: Self-pay | Admitting: Physical Therapy

## 2018-06-05 DIAGNOSIS — G8929 Other chronic pain: Secondary | ICD-10-CM

## 2018-06-05 DIAGNOSIS — M545 Low back pain, unspecified: Secondary | ICD-10-CM

## 2018-06-05 DIAGNOSIS — F418 Other specified anxiety disorders: Secondary | ICD-10-CM | POA: Diagnosis not present

## 2018-06-05 NOTE — Therapy (Signed)
[place] [facility] [address], [place], [address] Phone: [phone]   Fax:  [phone]  Physical Therapy Treatment  Patient Details  Name: Ryan Dudley MRN: [id number] Date of Birth: 04-03-1981 Referring Provider (PT): Jen Mow   Encounter Date: 06/05/2018  PT End of Session - 06/05/18 0959    Visit Number  2    Number of Visits  17    Date for PT Re-Evaluation  07/14/18    PT Start Time  0935    PT Stop Time  1017    PT Time Calculation (min)  42 min    Activity Tolerance  Patient tolerated treatment well    Behavior During Therapy  [facility] for tasks assessed/performed       History reviewed. No pertinent past medical history.  Past Surgical History:  Procedure Laterality Date  . MOUTH SURGERY      There were no vitals filed for this visit.  Subjective Assessment - 06/05/18 0941    Subjective  Patient reports some improvement with LBP with HEP stretching. Patient continues to report LBP wit sitting "crossed legged" and with prolonged driving. Pt reports 3/10 pain this am    Pertinent History  Patient is a 37 year old male presenting with chronic LBP over the past 4-104months. Patient reports he exacerbated this pain when squatting in the gym. Patient reports worst pain over the past week 5/10 and best 2/10. Patient reports his pain is a constant, dull ache, with occassional sharp pain with bending/extending. Patient reports midline LBP with occassional radiation into the R buttock. Patient is very active and reports he runs 2x/week and lifts weights 3x/week. Patient reports he is having trouble with core workouts (sit ups), and running. Patient reports his work commute is 1.5 hours to [employer], which increases his pain. Pt denies N/V, unexplained weight fluctuation, saddle paresthesia, fever, night sweats, or unrelenting night pain at this time.    Limitations  Sitting;Lifting;Standing;Walking;House hold  activities    How long can you sit comfortably?     How long can you stand comfortably?  30-29min    How long can you walk comfortably?  30-94min    Diagnostic tests  XRAY unremarkable    Patient Stated Goals  Return to normal workout regimen    Pain Onset  More than a month ago          Manual - STM with trigger point release bilat paraspinals  Following: (4) 60mm .30 needles placed along bilat lumbar paraspinals to decrease increased muscular spasms and trigger points with the patient positioned in supine. Patient was educated on risks and benefits of therapy and verbally consents to PT.  - grade I-II CPA L1-S1 30sec bouts, 6 bouts each segment for pain modulation   ESTIM + heat pack HiVolt ESTIM 15 min at patient tolerated 135V > 140V R lumbar paraspinals to 140V > 150V at L lumbar paraspinals . Attempted to decrease.muscular tension and pain in this area. With PT assessing patient tolerance throughout (increasing intensity as needed), monitoring skin integrity (normal), with decreased pain noted from patient. Utilized time to discuss psychological factors influence on pain/muscle tension and reducing tension through breathing control and relaxation exercises.     Ther-Ex -Childs pose on theraball from seated position 30sec hold x2 with breathing control; to R 2x 30sec hold and L 2x 30sec hold  PT Education - 06/05/18 0958    Education Details  TDN; ESTIM education    Person(s) Educated  Patient    Methods  Explanation    Comprehension  Verbalized understanding;Verbal cues required       PT Short Term Goals - 05/20/18 0216      PT SHORT TERM GOAL #1   Title  Pt will be independent with HEP in order to improve strength and balance in order to decrease fall risk and improve function at home and work.    Time  4    Period  Weeks    Status  New        PT Long Term Goals - 05/20/18 0216      PT LONG TERM GOAL #1   Title  Patient  will increase FOTO score to 72 to demonstrate predicted increase in functional mobility to complete ADLs    Baseline  05/19/18: 58    Time  8    Period  Weeks    Status  New      PT LONG TERM GOAL #2   Title  Pt will decrease worst pain as reported on NPRS by at least 3 points in order to demonstrate clinically significant reduction in pain.    Baseline  05/19/18 5/10 with work commute    Time  8    Period  Weeks    Status  New            Plan - 06/05/18 1414    Clinical Impression Statement  Patient demonstrates some decreased pain post manual + TDN techniques. PT utilized stretching for carry over of maual techniques which patient demonstrated with accuracy. PT educated patient on breath control and relaxation to adjunct tension relief throug therex/PT, and pt verbalized understanding. Will follow up on pain reponse following TDN next session.     Rehab Potential  Good    Clinical Impairments Affecting Rehab Potential  (-) chronicity of sx, labor intense job, prolonged work commute    PT Frequency  2x / week    PT Duration  8 weeks    PT Treatment/Interventions  ADLs/Self Care Home Management;Iontophoresis 4mg /ml Dexamethasone;Gait training;Neuromuscular re-education;Passive range of motion;Dry needling;Spinal Manipulations;Taping;Balance training;Electrical Stimulation;Cryotherapy;Therapeutic exercise;Patient/family education;Therapeutic activities;Aquatic Therapy;Moist Heat    PT Next Visit Plan  lumbar instability test; squat assessment; HEP review    PT Home Exercise Plan  Childs pose/latera bias, postural education    Consulted and Agree with Plan of Care  Patient       Patient will benefit from skilled therapeutic intervention in order to improve the following deficits and impairments:  Abnormal gait, Increased fascial restricitons, Improper body mechanics, Pain, Decreased mobility, Hypermobility, Postural dysfunction, Decreased strength, Decreased range of motion, Decreased  activity tolerance  Visit Diagnosis: Chronic bilateral low back pain without sciatica     Problem List Patient Active Problem List   Diagnosis Date Noted  . Tobacco abuse 05/12/2017   Staci Acostahelsea Miller PT, DPT Staci Acostahelsea Miller 06/05/2018, 2:16 PM  [place] [facility] [address] , [place], [id number] Phone: [phone]   Fax:  [phone]  Name: Ryan MedicoDavid Dudley MRN: [id number] Date of Birth: 16-Dec-1980

## 2018-06-07 ENCOUNTER — Encounter: Payer: Self-pay | Admitting: Physical Therapy

## 2018-06-07 ENCOUNTER — Ambulatory Visit: Admitting: Physical Therapy

## 2018-06-07 DIAGNOSIS — M545 Low back pain, unspecified: Secondary | ICD-10-CM

## 2018-06-07 DIAGNOSIS — G8929 Other chronic pain: Secondary | ICD-10-CM

## 2018-06-07 NOTE — Therapy (Signed)
[facility] [address], [place], [address] Phone: [phone]   Fax:  [phone]  Physical Therapy Treatment  Patient Details  Name: Ryan Dudley MRN: [id number] Date of Birth: 1981/07/01 Referring Provider (PT): Venita Sheffield   Encounter Date: 06/07/2018  PT End of Session - 06/07/18 2458    Visit Number  3    Number of Visits  17    Date for PT Re-Evaluation  07/14/18    PT Start Time  0815    PT Stop Time  0900    PT Time Calculation (min)  45 min    Activity Tolerance  Patient tolerated treatment well    Behavior During Therapy  [facility] for tasks assessed/performed       History reviewed. No pertinent past medical history.  Past Surgical History:  Procedure Laterality Date  . MOUTH SURGERY      There were no vitals filed for this visit.  Subjective Assessment - 06/07/18 0819    Subjective  Patient reports some improvement with last session, but reports after his drive to work his pain came back on. Patient reports 2/10 this morning. Patient reports compliance with his HEP.     Pertinent History  Patient is a 37 year old male presenting with chronic LBP over the past 4-90month. Patient reports he exacerbated this pain when squatting in the gym. Patient reports worst pain over the past week 5/10 and best 2/10. Patient reports his pain is a constant, dull ache, with occassional sharp pain with bending/extending. Patient reports midline LBP with occassional radiation into the R buttock. Patient is very active and reports he runs 2x/week and lifts weights 3x/week. Patient reports he is having trouble with core workouts (sit ups), and running. Patient reports his work commute is 1.5 hours to [employer] which increases his pain. Pt denies N/V, unexplained weight fluctuation, saddle paresthesia, fever, night sweats, or unrelenting night pain at this time.    Limitations  Sitting;Lifting;Standing;Walking;House  hold activities    How long can you sit comfortably?  320m    How long can you stand comfortably?  30-6039m   How long can you walk comfortably?  30-23m47m  Diagnostic tests  XRAY unremarkable    Patient Stated Goals  Return to normal workout regimen    Pain Onset  More than a month ago       Manual - STM with trigger point release bilat paraspinals  Following: (4) 23mm73m needles placed along bilat lumbar paraspinals to decrease increased muscular spasms and trigger points with the patient positioned in supine. Patient was educated on risks and benefits of therapy and verbally consents to PT.  - grade I-II CPA L1-S1 30sec bouts, 6 bouts each segment for pain modulation  ESTIM+ heat packHiVolt ESTIM12 min at patient tolerated210 > 220V at bilat lumbar paraspinals. Attempted to decrease.muscular tension and pain in this area. With PT assessing patient tolerance throughout (increasing intensity as needed), monitoring skin integrity (normal), with decreased pain noted from patient. PT continued to educate patient on mind-body connection and phycological factors and their impact on pain experience   Therapeutic Activity - Trials of sitting posture to mimic sitting in a car, where patient is demonstrating good posture, but some excessive lumbar lordosis in attempt to sit forward with "proper posture" PT suggested patient utilize towel to "met" lumbar curvature to prevent over lumbar extension/over-activation of lumbar paraspinals. Patient is able to demonstrate this with accuracy  to implement                         PT Education - 06/07/18 0821    Education Details  TDN    Person(s) Educated  Patient    Methods  Explanation;Demonstration    Comprehension  Verbalized understanding;Returned demonstration       PT Short Term Goals - 05/20/18 0216      PT SHORT TERM GOAL #1   Title  Pt will be independent with HEP in order to improve strength and balance in order  to decrease fall risk and improve function at home and work.    Time  4    Period  Weeks    Status  New        PT Long Term Goals - 05/20/18 0216      PT LONG TERM GOAL #1   Title  Patient will increase FOTO score to 72 to demonstrate predicted increase in functional mobility to complete ADLs    Baseline  05/19/18: 58    Time  8    Period  Weeks    Status  New      PT LONG TERM GOAL #2   Title  Pt will decrease worst pain as reported on NPRS by at least 3 points in order to demonstrate clinically significant reduction in pain.    Baseline  05/19/18 5/10 with work commute    Time  [place] - 06/07/18 2247    Clinical Impression Statement  Patient with decreased palpable muscle tension following manual and modality techniques. PT educated patient on proper driving mechanics with demonstrate for carry over of session gains. Patient is able to verbalized understanding and demonstrate all provided education. PT will continue to relieve muscle tension as able and follow up on carry over with postural correction.     Rehab Potential  Good    Clinical Impairments Affecting Rehab Potential  (-) chronicity of sx, labor intense job, prolonged work commute    PT Frequency  2x / week    PT Duration  8 weeks    PT Treatment/Interventions  ADLs/Self Care Home Management;Iontophoresis 63m/ml Dexamethasone;Gait training;Neuromuscular re-education;Passive range of motion;Dry needling;Spinal Manipulations;Taping;Balance training;Electrical Stimulation;Cryotherapy;Therapeutic exercise;Patient/family education;Therapeutic activities;Aquatic Therapy;Moist Heat    PT Next Visit Plan  lumbar instability test; squat assessment; HEP review    PT Home Exercise Plan  Childs pose/latera bias, postural education    Consulted and Agree with Plan of Care  Patient       Patient will benefit from skilled therapeutic intervention in order to improve the following  deficits and impairments:  Abnormal gait, Increased fascial restricitons, Improper body mechanics, Pain, Decreased mobility, Hypermobility, Postural dysfunction, Decreased strength, Decreased range of motion, Decreased activity tolerance  Visit Diagnosis: Chronic bilateral low back pain without sciatica     Problem List Patient Active Problem List   Diagnosis Date Noted  . Tobacco abuse 05/12/2017   CShelton SilvasPT, DPT CShelton Silvas11/20/2019, 10:50 PM  [facility] [address] [place] [address]Phone: [phone]  Fax:  [phone] Name: DTycen DockterMRN: [id number] of Birth: 305-20-1982

## 2018-06-12 ENCOUNTER — Encounter: Payer: Self-pay | Admitting: Physical Therapy

## 2018-06-12 ENCOUNTER — Ambulatory Visit: Admitting: Physical Therapy

## 2018-06-12 DIAGNOSIS — M545 Low back pain, unspecified: Secondary | ICD-10-CM

## 2018-06-12 DIAGNOSIS — G8929 Other chronic pain: Secondary | ICD-10-CM

## 2018-06-12 NOTE — Therapy (Signed)
[facility] [address], [place], [address] Phone: [phone]   Fax:  [phone]  Physical Therapy Treatment  Patient Details  Name: Ryan Dudley MRN: [id number] Date of Birth: [birth year] Referring Provider (PT): Jen Mow   Encounter Date: 06/12/2018  PT End of Session - 06/12/18 0909    Visit Number  4    Number of Visits  17    Date for PT Re-Evaluation  07/14/18    PT Start Time  0900    PT Stop Time  0945    PT Time Calculation (min)  45 min    Activity Tolerance  Patient tolerated treatment well    Behavior During Therapy  [facility] for tasks assessed/performed       History reviewed. No pertinent past medical history.  Past Surgical History:  Procedure Laterality Date  . MOUTH SURGERY      There were no vitals filed for this visit.  Subjective Assessment - 06/12/18 0911    Subjective  Patient reports decreased pain following ESTIM + heat. Patient reports continued ache in the LB with driving. Patient reports 2/10 pain this am.     Pertinent History  Patient is a 37 year old male presenting with chronic LBP over the past 4-85months. Patient reports he exacerbated this pain when squatting in the gym. Patient reports worst pain over the past week 5/10 and best 2/10. Patient reports his pain is a constant, dull ache, with occassional sharp pain with bending/extending. Patient reports midline LBP with occassional radiation into the R buttock. Patient is very active and reports he runs 2x/week and lifts weights 3x/week. Patient reports he is having trouble with core workouts (sit ups), and running. Patient reports his work commute is 1.5 hours to [employer], which increases his pain. Pt denies N/V, unexplained weight fluctuation, saddle paresthesia, fever, night sweats, or unrelenting night pain at this time.    Limitations  Sitting;Lifting;Standing;Walking;House hold activities    How long can you sit  comfortably?     How long can you stand comfortably?  30-63min    How long can you walk comfortably?  30-54min    Diagnostic tests  XRAY unremarkable       Manual -STM withtrigger point releasebilat paraspinals - grade I-II CPA L1-S1 30sec bouts, 6 bouts each segment for pain modulation - bilat glute/piriformis stretch 45sec holds x2 each side  ESTIM+ heat packHiVolt ESTIM12 min at patient tolerated190 > 200V at bilat lumbar paraspinals. Attemptedto decrease.muscular tension and pain in this area.With PT assessing patient tolerance throughout (increasing intensity as needed), monitoring skin integrity (normal), with decreased pain noted from patient. Discussed sitting posture with patient, as patient reports he did not know if lumbar support "helped". PT encouraged patient to continue utilizing a support at the lumbar spine to prevent extended muscle activation.   Therapeutic Exercise - Double knee to chest with rock x20 - Lower extremity trunk rotations with cuing to maintain back contact with mat table x20 - lower trunk rotation stretch 2x each side x45sec holds - Discussion on continuing hip strengthening exercises to promote hip stability                         PT Education - 06/12/18 0914    Education Details  Pain science education    Person(s) Educated  Patient    Methods  Explanation;Verbal cues    Comprehension  Verbalized understanding;Verbal cues required  PT Short Term Goals - 05/20/18 0216      PT SHORT TERM GOAL #1   Title  Pt will be independent with HEP in order to improve strength and balance in order to decrease fall risk and improve function at home and work.    Time  4    Period  Weeks    Status  New        PT Long Term Goals - 05/20/18 0216      PT LONG TERM GOAL #1   Title  Patient will increase FOTO score to 72 to demonstrate predicted increase in functional mobility to complete ADLs    Baseline   05/19/18: 58    Time  8    Period  Weeks    Status  New      PT LONG TERM GOAL #2   Title  Pt will decrease worst pain as reported on NPRS by at least 3 points in order to demonstrate clinically significant reduction in pain.    Baseline  05/19/18 5/10 with work commute    Time  8    Period  Weeks    Status  New            Plan - 06/12/18 1257    Clinical Impression Statement  Patient continues to report decreased pain following manual/modality treatment. Patient reports no pain at the end of session. PT continued to encourage HEP with proper posture for carry over. PT also educated patient on importance of hip strengthening to promote hip stability to prevent LB compensation. Patient verbalized understanding of all provided education.     Rehab Potential  Good    Clinical Impairments Affecting Rehab Potential  (-) chronicity of sx, labor intense job, prolonged work commute    PT Frequency  2x / week    PT Duration  8 weeks    PT Treatment/Interventions  ADLs/Self Care Home Management;Iontophoresis 4mg /ml Dexamethasone;Gait training;Neuromuscular re-education;Passive range of motion;Dry needling;Spinal Manipulations;Taping;Balance training;Electrical Stimulation;Cryotherapy;Therapeutic exercise;Patient/family education;Therapeutic activities;Aquatic Therapy;Moist Heat    PT Next Visit Plan  lumbar instability test; squat assessment; HEP review    PT Home Exercise Plan  Childs pose/latera bias, postural education    Consulted and Agree with Plan of Care  Patient       Patient will benefit from skilled therapeutic intervention in order to improve the following deficits and impairments:  Abnormal gait, Increased fascial restricitons, Improper body mechanics, Pain, Decreased mobility, Hypermobility, Postural dysfunction, Decreased strength, Decreased range of motion, Decreased activity tolerance  Visit Diagnosis: Chronic bilateral low back pain without sciatica     Problem  List Patient Active Problem List   Diagnosis Date Noted  . Tobacco abuse 05/12/2017   Staci Acostahelsea Miller PT, DPT Staci Acostahelsea Miller 06/12/2018, 1:01 PM  [facility] [address] [place], [place], [id number] Phone: [phone]   Fax:  [phone]  Name: Ryan MedicoDavid Dudley MRN: [id number] Date of Birth: [birth year]

## 2018-06-13 ENCOUNTER — Ambulatory Visit (INDEPENDENT_AMBULATORY_CARE_PROVIDER_SITE_OTHER): Admitting: Psychology

## 2018-06-13 DIAGNOSIS — F418 Other specified anxiety disorders: Secondary | ICD-10-CM

## 2018-06-14 ENCOUNTER — Ambulatory Visit: Admitting: Physical Therapy

## 2018-06-19 ENCOUNTER — Ambulatory Visit: Admitting: Physical Therapy

## 2018-06-21 ENCOUNTER — Ambulatory Visit: Attending: Physician Assistant | Admitting: Physical Therapy

## 2018-06-21 ENCOUNTER — Encounter: Payer: Self-pay | Admitting: Physical Therapy

## 2018-06-21 DIAGNOSIS — M545 Low back pain, unspecified: Secondary | ICD-10-CM

## 2018-06-21 DIAGNOSIS — G8929 Other chronic pain: Secondary | ICD-10-CM | POA: Insufficient documentation

## 2018-06-21 NOTE — Therapy (Signed)
[facility] [address] [place], [address] Phone: [phone]   Fax:  [phone]  Physical Therapy Treatment  Patient Details  Name: Ryan Dudley MRN: [id number] Date of Birth: 1981/07/05 Referring Provider (PT): Jen Mow   Encounter Date: 06/21/2018  PT End of Session - 06/21/18 0840    Visit Number  5    Number of Visits  17    Date for PT Re-Evaluation  07/14/18    PT Start Time  0815    PT Stop Time  0900    PT Time Calculation (min)  45 min    Activity Tolerance  Patient tolerated treatment well    Behavior During Therapy  [facility] for tasks assessed/performed       History reviewed. No pertinent past medical history.  Past Surgical History:  Procedure Laterality Date  . MOUTH SURGERY      There were no vitals filed for this visit.  Subjective Assessment - 06/21/18 0821    Subjective  Patient reports his pain is getting better overall. Patient reports he is still having increased pain after long drives. Patient reportss 1/10 pain today.     Pertinent History  Patient is a 37 year old male presenting with chronic LBP over the past 4-33months. Patient reports he exacerbated this pain when squatting in the gym. Patient reports worst pain over the past week 5/10 and best 2/10. Patient reports his pain is a constant, dull ache, with occassional sharp pain with bending/extending. Patient reports midline LBP with occassional radiation into the R buttock. Patient is very active and reports he runs 2x/week and lifts weights 3x/week. Patient reports he is having trouble with core workouts (sit ups), and running. Patient reports his work commute is 1.5 hours to [employer], which increases his pain. Pt denies N/V, unexplained weight fluctuation, saddle paresthesia, fever, night sweats, or unrelenting night pain at this time.    Limitations  Sitting;Lifting;Standing;Walking;House hold activities    How long can  you sit comfortably?     How long can you stand comfortably?  30-77min    How long can you walk comfortably?  30-39min    Diagnostic tests  XRAY unremarkable    Patient Stated Goals  Return to normal workout regimen    Pain Onset  More than a month ago       Manual -STM withtrigger point releasebilat paraspinals - grade I-II CPA L1-S1 30sec bouts, 6 bouts each segment for pain modulation  ESTIM+ heat packHiVolt ESTIM16min at patient tolerated140 > 165V at bilat lumbar paraspinals. Attemptedto decrease.muscular tension and pain in this area.With PT assessing patient tolerance throughout (increasing intensity as needed), monitoring skin integrity (normal), with decreased pain noted from patient. During this time educated patient on exercise regimen to add in hip ext/abd strengthening in isolation with examples of cable and machine therex for this. Education on impact of glute med weakness (compared to other hip/LB stabilizing musculature) and this impact on LB stability  Therapeutic Exercise -MATRIX bilat hip ext 85# x10; 100# x10; 115# x10 with min cuing initially for proper form without lumbar ext  -MATRIX bilat hip abd 85# 2 x10 each with patient demonstrating inc difficulty with eccentric control - Education on directional preference training with advisement to attempt 10 low back ext every 30-56min and gauge pain response (continue if helpful/centralizing, stop if pain peripheralises)  PT Education - 06/21/18 0831    Education Details  Directional preference education    Person(s) Educated  Patient    Methods  Explanation;Verbal cues    Comprehension  Verbalized understanding;Verbal cues required       PT Short Term Goals - 05/20/18 0216      PT SHORT TERM GOAL #1   Title  Pt will be independent with HEP in order to improve strength and balance in order to decrease fall risk and improve function at home and work.     Time  4    Period  Weeks    Status  New        PT Long Term Goals - 05/20/18 0216      PT LONG TERM GOAL #1   Title  Patient will increase FOTO score to 72 to demonstrate predicted increase in functional mobility to complete ADLs    Baseline  05/19/18: 58    Time  8    Period  Weeks    Status  New      PT LONG TERM GOAL #2   Title  Pt will decrease worst pain as reported on NPRS by at least 3 points in order to demonstrate clinically significant reduction in pain.    Baseline  05/19/18 5/10 with work commute    Time  8    Period  Weeks    Status  New            Plan - 06/21/18 1101    Clinical Impression Statement  PT assessed patient's hip activation and strength. Patient demonstrates good hip ext strength with required cuing to prevent LB ext compensation/over activation. Patient demonstrates increased difficulty with glute med stabilization. PT spent time educating patient on importance of muscle isolation exercises to target intended muscles without compensation and the importance of lateral stabilizers on low back/hip stability. PT also advised patient in ext diretional preference to gauge response next session. Pt verbalized understanding of all provided education; PT will followup as able.     Rehab Potential  Good    Clinical Impairments Affecting Rehab Potential  (-) chronicity of sx, labor intense job, prolonged work commute    PT Frequency  2x / week    PT Duration  8 weeks    PT Treatment/Interventions  ADLs/Self Care Home Management;Iontophoresis 4mg /ml Dexamethasone;Gait training;Neuromuscular re-education;Passive range of motion;Dry needling;Spinal Manipulations;Taping;Balance training;Electrical Stimulation;Cryotherapy;Therapeutic exercise;Patient/family education;Therapeutic activities;Aquatic Therapy;Moist Heat    PT Next Visit Plan  lumbar instability test; squat assessment; HEP review    PT Home Exercise Plan  Childs pose/latera bias, postural education     Consulted and Agree with Plan of Care  Patient       Patient will benefit from skilled therapeutic intervention in order to improve the following deficits and impairments:  Abnormal gait, Increased fascial restricitons, Improper body mechanics, Pain, Decreased mobility, Hypermobility, Postural dysfunction, Decreased strength, Decreased range of motion, Decreased activity tolerance  Visit Diagnosis: Chronic bilateral low back pain without sciatica     Problem List Patient Active Problem List   Diagnosis Date Noted  . Tobacco abuse 05/12/2017   Staci Acostahelsea Miller PT, DPT Staci Acostahelsea Miller 06/21/2018, 11:06 AM  [facility] [address] [place], [place], [address] Phone: [phone]   Fax:  [phone]  Name: Ryan MedicoDavid Dudley MRN: [id number] Date of Birth: 1980/08/31

## 2018-06-22 ENCOUNTER — Ambulatory Visit (INDEPENDENT_AMBULATORY_CARE_PROVIDER_SITE_OTHER): Admitting: Psychology

## 2018-06-22 DIAGNOSIS — F418 Other specified anxiety disorders: Secondary | ICD-10-CM | POA: Diagnosis not present

## 2018-06-26 ENCOUNTER — Ambulatory Visit: Admitting: Physical Therapy

## 2018-06-28 ENCOUNTER — Encounter: Payer: Self-pay | Admitting: Physical Therapy

## 2018-06-28 ENCOUNTER — Ambulatory Visit: Admitting: Physical Therapy

## 2018-06-28 DIAGNOSIS — M545 Low back pain, unspecified: Secondary | ICD-10-CM

## 2018-06-28 DIAGNOSIS — G8929 Other chronic pain: Secondary | ICD-10-CM

## 2018-06-28 NOTE — Therapy (Signed)
[facility] PHYSICAL AND SPORTS MEDICINE [address], [place], [address] Phone: [phone]   Fax:  [phone]  Physical Therapy Treatment  Patient Details  Name: Ryan Dudley MRN: [id number] Date of Birth: 01-13-1981 Referring Provider (PT): Jen Mow   Encounter Date: 06/28/2018  PT End of Session - 06/28/18 0854    Visit Number  6    Number of Visits  17    Date for PT Re-Evaluation  07/14/18    PT Start Time  0825    PT Stop Time  0903    PT Time Calculation (min)  38 min    Activity Tolerance  Patient tolerated treatment well    Behavior During Therapy  [facility] for tasks assessed/performed       History reviewed. No pertinent past medical history.  Past Surgical History:  Procedure Laterality Date  . MOUTH SURGERY      There were no vitals filed for this visit.  Subjective Assessment - 06/28/18 0831    Subjective  Patient reports he is not having sharp pain anymore, only some constant dull ache. Patient reports pain is centralized to spine. Patient reports 3/10 pain today. Reports compliance with HEP and reports he cannot tell if ext based movement is helping his pain.     Pertinent History  Patient is a 37 year old male presenting with chronic LBP over the past 4-9months. Patient reports he exacerbated this pain when squatting in the gym. Patient reports worst pain over the past week 5/10 and best 2/10. Patient reports his pain is a constant, dull ache, with occassional sharp pain with bending/extending. Patient reports midline LBP with occassional radiation into the R buttock. Patient is very active and reports he runs 2x/week and lifts weights 3x/week. Patient reports he is having trouble with core workouts (sit ups), and running. Patient reports his work commute is 1.5 hours to [employer], which increases his pain. Pt denies N/V, unexplained weight fluctuation, saddle paresthesia, fever, night sweats, or unrelenting night  pain at this time.    Limitations  Sitting;Lifting;Standing;Walking;House hold activities    How long can you sit comfortably?     How long can you stand comfortably?  30-33min    How long can you walk comfortably?  30-54min    Diagnostic tests  XRAY unremarkable    Patient Stated Goals  Return to normal workout regimen    Pain Onset  More than a month ago           Manual -STM withtrigger point releasebilat paraspinals - grade I-II CPA L1-S1 30sec bouts, 6 bouts each segment for pain modulation; UPA grade III-IV for increased pain free rotation - STM to bilat lower lumbar paraspinals Dry Needling: (2) .30 needles placed along the bilat lumbar paraspinals/multifidi to decrease increased muscular spasms and trigger points with the patient positioned in supine. Patient was educated on risks and benefits of therapy and verbally consents to PT.   ESTIM+ heat packHiVolt ESTIM13min at patient tolerated180V at bilat lumbar paraspinals. Attemptedto decrease.muscular tension and pain in this area.With PT assessing patient tolerance throughout (increasing intensity as needed), monitoring skin integrity (normal), with decreased pain noted from patient.During this time educated patient on completing core exercises in isometric nature (plank, pallof press, leg lowers) vs. Repeated flex (crunch, sit up) to mimic how the core is used (ie for stability) patient verbalized understanding   TherapeuticExercise -Post pelvic tilt x10 with TC under LB to ensure core contraction -  Bilat supine leg lower with maintained low back contact with mat table x10 with good sustained core contraction noted - Palloff press variation demonstration with return demo: Palloff press 20# x10; with rotation 20# x10;  - Advisement to add isometric core activation exercises to exercise regimen along with planks (patient already completes), as opposed to crunch/sit up style therex                          PT Education - 06/28/18 0854    Education Details  Exercise techniques and core activation education    Person(s) Educated  Patient    Methods  Explanation;Demonstration;Verbal cues    Comprehension  Verbalized understanding;Returned demonstration;Verbal cues required       PT Short Term Goals - 05/20/18 0216      PT SHORT TERM GOAL #1   Title  Pt will be independent with HEP in order to improve strength and balance in order to decrease fall risk and improve function at home and work.    Time  4    Period  Weeks    Status  New        PT Long Term Goals - 05/20/18 0216      PT LONG TERM GOAL #1   Title  Patient will increase FOTO score to 72 to demonstrate predicted increase in functional mobility to complete ADLs    Baseline  05/19/18: 58    Time  8    Period  Weeks    Status  New      PT LONG TERM GOAL #2   Title  Pt will decrease worst pain as reported on NPRS by at least 3 points in order to demonstrate clinically significant reduction in pain.    Baseline  05/19/18 5/10 with work commute    Time  8    Period  Weeks    Status  New            Plan - 06/28/18 0914    Clinical Impression Statement  PT assessed patient's core strength where patient is able to demonstrate good core activation with dynamic movements. PT educated patient on core therex as isometric vs. "crunch"; patient verbalized understanding of reasoning. Through STM to lumbar paraspinals, pt notes that his pain is a "deeper ache", with good noted twitch response with TDN to multifidi. Patient reports decreased pain following manaual + modality techniques with some "soreness" post TDN. PT will continue to gauge patient response    Rehab Potential  Good    Clinical Impairments Affecting Rehab Potential  (-) chronicity of sx, labor intense job, prolonged work commute    PT Frequency  2x / week    PT Duration  8 weeks    PT Treatment/Interventions  ADLs/Self Care Home  Management;Iontophoresis 4mg /ml Dexamethasone;Gait training;Neuromuscular re-education;Passive range of motion;Dry needling;Spinal Manipulations;Taping;Balance training;Electrical Stimulation;Cryotherapy;Therapeutic exercise;Patient/family education;Therapeutic activities;Aquatic Therapy;Moist Heat    PT Next Visit Plan  lumbar instability test; squat assessment; HEP review    PT Home Exercise Plan  Childs pose/latera bias, postural education    Consulted and Agree with Plan of Care  Patient       Patient will benefit from skilled therapeutic intervention in order to improve the following deficits and impairments:  Abnormal gait, Increased fascial restricitons, Improper body mechanics, Pain, Decreased mobility, Hypermobility, Postural dysfunction, Decreased strength, Decreased range of motion, Decreased activity tolerance  Visit Diagnosis: Chronic bilateral low back pain without sciatica  Problem List Patient Active Problem List   Diagnosis Date Noted  . Tobacco abuse 05/12/2017   Staci Acosta PT, DPT Staci Acosta 06/28/2018, 10:25 AM  [facility] PHYSICAL AND SPORTS MEDICINE [address] [place], [address] Phone: [phone]   Fax:  [phone]  Name: Marvel Mcphillips MRN: [id number] Date of Birth: 1981/05/25

## 2018-07-03 ENCOUNTER — Ambulatory Visit: Admitting: Physical Therapy

## 2018-07-05 ENCOUNTER — Ambulatory Visit: Admitting: Physical Therapy

## 2018-07-10 ENCOUNTER — Ambulatory Visit: Admitting: Physical Therapy

## 2018-07-14 ENCOUNTER — Ambulatory Visit: Admitting: Physical Therapy

## 2018-07-17 ENCOUNTER — Ambulatory Visit: Admitting: Physical Therapy

## 2018-07-21 ENCOUNTER — Ambulatory Visit: Admitting: Physical Therapy

## 2018-07-21 ENCOUNTER — Ambulatory Visit (INDEPENDENT_AMBULATORY_CARE_PROVIDER_SITE_OTHER): Admitting: Physician Assistant

## 2018-07-21 ENCOUNTER — Encounter: Payer: Self-pay | Admitting: Physician Assistant

## 2018-07-21 VITALS — BP 129/83 | HR 62 | Temp 97.9°F | Wt 186.6 lb

## 2018-07-21 DIAGNOSIS — Z720 Tobacco use: Secondary | ICD-10-CM | POA: Diagnosis not present

## 2018-07-21 DIAGNOSIS — M25561 Pain in right knee: Secondary | ICD-10-CM

## 2018-07-21 MED ORDER — BUPROPION HCL ER (SR) 150 MG PO TB12: ORAL_TABLET | ORAL | 1 refills | Status: DC

## 2018-07-21 NOTE — Progress Notes (Signed)
Patient: Ryan Dudley Male    DOB: April 11, 1981   37 y.o.   MRN: [id number] Visit Date: 07/21/2018  Today's Provider: Trey Sailors, PA-C   Chief Complaint  Patient presents with  . Knee Pain   Subjective:     Presents today with knee pain x 1 mo. Running one month ago, felt twinge on the inside of his right knee after twisting a certain way. Has improved some, pain is not all the time. Pain is still located on inside of the right knee. He will have sensation that his knee will buckle.   Wt Readings from Last 3 Encounters:  07/21/18 186 lb 9.6 oz (84.6 kg)  05/08/18 182 lb 3.2 oz (82.6 kg)  04/03/18 184 lb (83.5 kg)    Knee Pain   Incident onset: approximately 1 month ago. Incident location: outside. Injury mechanism: running and making a abrupt stop. The pain is present in the right knee. Quality: "sharp" catching pain  The pain has been constant since onset. The symptoms are aggravated by weight bearing and movement. He has tried acetaminophen and ice for the symptoms.    Allergies  Allergen Reactions  . Ceclor [Cefaclor]     States he was administered this while hospitalized without reaction.     Current Outpatient Medications:  .  buPROPion (WELLBUTRIN SR) 150 MG 12 hr tablet, TAKE 1 TABLET BY MOUTH DAILY FOR 3 DAYS, THEN INCREASE TO 1 TABLET TWICE DAILY AS DIRECTED (Patient not taking: Reported on 07/21/2018), Disp: 180 tablet, Rfl: 1  Review of Systems  Constitutional: Negative.   Respiratory: Negative.   Cardiovascular: Negative.   Musculoskeletal:       Right knee pain     Social History   Tobacco Use  . Smoking status: Former Smoker    Packs/day: 0.75    Types: Cigarettes    Last attempt to quit: 05/19/2017    Years since quitting: 1.1  . Smokeless tobacco: Former Engineer, water Use Topics  . Alcohol use: No      Objective:   BP 129/83 (BP Location: Right Arm, Patient Position: Sitting, Cuff Size: Large)   Pulse 62   Temp 97.9 F (36.6 C)  (Oral)   Wt 186 lb 9.6 oz (84.6 kg)   SpO2 99%   BMI 29.23 kg/m  Vitals:   07/21/18 1605  BP: 129/83  Pulse: 62  Temp: 97.9 F (36.6 C)  TempSrc: Oral  SpO2: 99%  Weight: 186 lb 9.6 oz (84.6 kg)     Physical Exam Constitutional:      Appearance: Normal appearance.  Musculoskeletal: Normal range of motion.        General: No swelling, tenderness, deformity or signs of injury.     Right knee: He exhibits normal range of motion, no swelling, no effusion, no ecchymosis, no deformity, no laceration, no erythema, normal alignment, no LCL laxity, normal patellar mobility, no bony tenderness, normal meniscus and no MCL laxity. No tenderness found. No medial joint line, no lateral joint line, no MCL, no LCL and no patellar tendon tenderness noted.     Right lower leg: No edema.     Left lower leg: No edema.  Skin:    General: Skin is warm and dry.  Neurological:     Mental Status: He is alert.         Assessment & Plan    1. Acute pain of right knee  Exam overall benign, possibly soft tissue  injury - MCL vs meniscal tear. 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  - Ambulatory referral to Orthopedics  2. Tobacco abuse  - buPROPion (WELLBUTRIN SR) 150 MG 12 hr tablet; TAKE 1 TABLET BY MOUTH DAILY FOR 3 DAYS, THEN INCREASE TO 1 TABLET TWICE DAILY AS DIRECTED  Dispense: 180 tablet; Refill: 1  The entirety of the information documented in the History of Present Illness, Review of Systems and Physical Exam were personally obtained by me. Portions of this information were initially documented by Hetty ElyJoseline Rosas, CMA and reviewed by me for thoroughness and accuracy.     Return if symptoms worsen or fail to improve.     Trey SailorsAdriana M Pollak, PA-C  [facility]

## 2018-07-21 NOTE — Patient Instructions (Signed)
Meniscus Tear    A meniscus tear is a knee injury that happens when a piece of the meniscus is torn. The meniscus is a thick, rubbery, wedge-shaped cartilage in the knee. Two menisci are located in each knee. They sit between the upper bone (femur) and lower bone (tibia) that make up the knee joint. Each meniscus acts as a shock absorber for the knee.  A torn meniscus is one of the most common types of knee injuries. This injury can range from mild to severe. Surgery may be needed to repair a severe tear.  What are the causes?  This condition may be caused by any kneeling, squatting, twisting, or pivoting movement. Sports-related injuries are the most common cause. These often occur from:  · Running and stopping suddenly.  ? Changing direction.  ? Being tackled or knocked off your feet.  · Lifting or carrying heavy weights.  As people get older, their menisci get thinner and weaker. In these people, tears can happen more easily, such as from climbing stairs.  What increases the risk?  You are more likely to develop this condition if you:  · Play contact sports.  · Have a job that requires kneeling or squatting.  · Are male.  · Are over 40 years old.  What are the signs or symptoms?  Symptoms of this condition include:  · Knee pain, especially at the side of the knee joint. You may feel pain when the injury occurs, or you may only hear a pop and feel pain later.  · A feeling that your knee is clicking, catching, locking, or giving way.  · Not being able to fully bend or extend your knee.  · Bruising or swelling in your knee.  How is this diagnosed?  This condition may be diagnosed based on your symptoms and a physical exam.  You may also have tests, such as:  · X-rays.  · MRI.  · A procedure to look inside your knee with a narrow surgical telescope (arthroscopy).  You may be referred to a knee specialist (orthopedic surgeon).  How is this treated?  Treatment for this injury depends on the severity of the tear.  Treatment for a mild tear may include:  · Rest.  · Medicine to reduce pain and swelling. This is usually a nonsteroidal anti-inflammatory drug (NSAID), like ibuprofen.  · A knee brace, sleeve, or wrap.  · Using crutches or a walker to keep weight off your knee and to help you walk.  · Exercises to strengthen your knee (physical therapy).  You may need surgery if you have a severe tear or if other treatments are not working.  Follow these instructions at home:  If you have a brace, sleeve, or wrap:  · Wear it as told by your health care provider. Remove it only as told by your health care provider.  · Loosen the brace, sleeve, or wrap if your toes tingle, become numb, or turn cold and blue.  · Keep the brace, sleeve, or wrap clean and dry.  · If the brace, sleeve, or wrap is not waterproof:  ? Do not let it get wet.  ? Cover it with a watertight covering when you take a bath or shower.  Managing pain and swelling    · Take over-the-counter and prescription medicines only as told by your health care provider.  · If directed, put ice on your knee:  ? If you have a removable brace, sleeve, or wrap, remove it   as told by your health care provider.  ? Put ice in a plastic bag.  ? Place a towel between your skin and the bag.  ? Leave the ice on for 20 minutes, 2-3 times per day.  · Move your toes often to avoid stiffness and to lessen swelling.  · Raise (elevate) the injured area above the level of your heart while you are sitting or lying down.  Activity  · Do not use the injured limb to support your body weight until your health care provider says that you can. Use crutches or a walker as told by your health care provider.  · Return to your normal activities as told by your health care provider. Ask your health care provider what activities are safe for you.  · Perform range-of-motion exercises only as told by your health care provider.  · Begin doing exercises to strengthen your knee and leg muscles only as told by your  health care provider. After you recover, your health care provider may recommend these exercises to help prevent another injury.  General instructions  · Use a knee brace, sleeve, or wrap as told by your health care provider.  · Ask your health care provider when it is safe to drive if you have a brace, sleeve, or wrap on your knee.  · Do not use any products that contain nicotine or tobacco, such as cigarettes, e-cigarettes, and chewing tobacco. If you need help quitting, ask your health care provider.  · Ask your health care provider if the medicine prescribed to you:  ? Requires you to avoid driving or using heavy machinery.  ? Can cause constipation. You may need to take these actions to prevent or treat constipation:  § Drink enough fluid to keep your urine pale yellow.  § Take over-the-counter or prescription medicines.  § Eat foods that are high in fiber, such as beans, whole grains, and fresh fruits and vegetables.  § Limit foods that are high in fat and processed sugars, such as fried or sweet foods.  · Keep all follow-up visits as told by your health care provider. This is important.  Contact a health care provider if:  · You have a fever.  · Your knee becomes red, tender, or swollen.  · Your pain medicine is not helping.  · Your symptoms get worse or do not improve after 2 weeks of home care.  Summary  · A meniscus tear is a knee injury that happens when a piece of the meniscus is torn.  · Treatment for this injury depends on the severity of the tear. You may need surgery if you have a severe tear or if other treatments are not working.  · Rest, ice, and raise (elevate) your injured knee as told by your health care provider. This will help lessen pain and swelling.  · Contact a health care provider if you have new symptoms, or your symptoms get worse or do not improve after 2 weeks of home care.  · Keep all follow-up visits as told by your health care provider. This is important.  This information is not  intended to replace advice given to you by your health care provider. Make sure you discuss any questions you have with your health care provider.  Document Released: 09/25/2002 Document Revised: 01/17/2018 Document Reviewed: 01/17/2018  Elsevier Interactive Patient Education © 2019 Elsevier Inc[employer]

## 2018-07-24 ENCOUNTER — Ambulatory Visit (INDEPENDENT_AMBULATORY_CARE_PROVIDER_SITE_OTHER): Admitting: Psychology

## 2018-07-24 DIAGNOSIS — F418 Other specified anxiety disorders: Secondary | ICD-10-CM

## 2018-08-04 ENCOUNTER — Telehealth: Payer: Self-pay

## 2018-08-04 NOTE — Telephone Encounter (Signed)
Referral has been submitted~~Waiting on auth

## 2018-08-04 NOTE — Telephone Encounter (Signed)
Patient called stating that Ryan Dudley referred him to Ortho. He has an appointment scheduled for the 24th at [facility], but he says his insurance has not been contacted yet to get authorization from his PCP. Patient has [employer] remote, and all referrals have to be authorized by his PCP. Patient call back is [phone].

## 2018-08-08 ENCOUNTER — Ambulatory Visit: Payer: Self-pay | Admitting: Psychology

## 2018-08-09 NOTE — Telephone Encounter (Signed)
Auth # [id number] valid 08/01/18 through 08/01/19 for 4 visits

## 2018-08-16 ENCOUNTER — Ambulatory Visit (INDEPENDENT_AMBULATORY_CARE_PROVIDER_SITE_OTHER): Admitting: Physician Assistant

## 2018-08-16 ENCOUNTER — Encounter: Payer: Self-pay | Admitting: Physician Assistant

## 2018-08-16 VITALS — BP 137/88 | HR 99 | Temp 99.5°F | Resp 16 | Wt 187.0 lb

## 2018-08-16 DIAGNOSIS — R509 Fever, unspecified: Secondary | ICD-10-CM | POA: Diagnosis not present

## 2018-08-16 DIAGNOSIS — J069 Acute upper respiratory infection, unspecified: Secondary | ICD-10-CM | POA: Diagnosis not present

## 2018-08-16 LAB — POCT RAPID STREP A (OFFICE): Rapid Strep A Screen: NEGATIVE

## 2018-08-16 LAB — POCT INFLUENZA A/B
Influenza A, POC: NEGATIVE
Influenza B, POC: NEGATIVE

## 2018-08-16 NOTE — Patient Instructions (Signed)
Viral Respiratory Infection  A viral respiratory infection is an illness that affects parts of the body that are used for breathing. These include the lungs, nose, and throat. It is caused by a germ called a virus.  Some examples of this kind of infection are:  · A cold.  · The flu (influenza).  · A respiratory syncytial virus (RSV) infection.  A person who gets this illness may have the following symptoms:  · A stuffy or runny nose.  · Yellow or green fluid in the nose.  · A cough.  · Sneezing.  · Tiredness (fatigue).  · Achy muscles.  · A sore throat.  · Sweating or chills.  · A fever.  · A headache.  Follow these instructions at home:  Managing pain and congestion  · Take over-the-counter and prescription medicines only as told by your doctor.  · If you have a sore throat, gargle with salt water. Do this 3-4 times per day or as needed. To make a salt-water mixture, dissolve ½-1 tsp of salt in 1 cup of warm water. Make sure that all the salt dissolves.  · Use nose drops made from salt water. This helps with stuffiness (congestion). It also helps soften the skin around your nose.  · Drink enough fluid to keep your pee (urine) pale yellow.  General instructions    · Rest as much as possible.  · Do not drink alcohol.  · Do not use any products that have nicotine or tobacco, such as cigarettes and e-cigarettes. If you need help quitting, ask your doctor.  · Keep all follow-up visits as told by your doctor. This is important.  How is this prevented?    · Get a flu shot every year. Ask your doctor when you should get your flu shot.  · Do not let other people get your germs. If you are sick:  ? Stay home from work or school.  ? Wash your hands with soap and water often. Wash your hands after you cough or sneeze. If soap and water are not available, use hand sanitizer.  · Avoid contact with people who are sick during cold and flu season. This is in fall and winter.  Get help if:  · Your symptoms last for 10 days or  longer.  · Your symptoms get worse over time.  · You have a fever.  · You have very bad pain in your face or forehead.  · Parts of your jaw or neck become very swollen.  Get help right away if:  · You feel pain or pressure in your chest.  · You have shortness of breath.  · You faint or feel like you will faint.  · You keep throwing up (vomiting).  · You feel confused.  Summary  · A viral respiratory infection is an illness that affects parts of the body that are used for breathing.  · Examples of this illness include a cold, the flu, and respiratory syncytial virus (RSV) infection.  · The infection can cause a runny nose, cough, sneezing, sore throat, and fever.  · Follow what your doctor tells you about taking medicines, drinking lots of fluid, washing your hands, resting at home, and avoiding people who are sick.  This information is not intended to replace advice given to you by your health care provider. Make sure you discuss any questions you have with your health care provider.  Document Released: 06/17/2008 Document Revised: 08/15/2017 Document Reviewed: 08/15/2017  Elsevier   Interactive Patient Education © 2019 Elsevier Inc[employer]

## 2018-08-16 NOTE — Progress Notes (Signed)
Patient: Ryan Dudley Male    DOB: 04-30-1981   37 y.o.   MRN: [id number] Visit Date: 08/18/2018  Today's Provider: Trey Sailors, PA-C   Chief Complaint  Patient presents with  . URI   Subjective:     HPI Upper Respiratory Infection: Patient complains of symptoms of a URI. Symptoms include sore throat and body aches. Onset of symptoms was 3 days ago, unchanged since that time. Ryan Dudley also c/o sore throat for the past 2 days .  Ryan Dudley is drinking plenty of fluids. Evaluation to date: none. Treatment to date: cough suppressants and decongestants. Wife and children are sick with the same.     Allergies  Allergen Reactions  . Ceclor [Cefaclor]     States Ryan Dudley was administered this while hospitalized without reaction.     Current Outpatient Medications:  .  buPROPion (WELLBUTRIN SR) 150 MG 12 hr tablet, TAKE 1 TABLET BY MOUTH DAILY FOR 3 DAYS, THEN INCREASE TO 1 TABLET TWICE DAILY AS DIRECTED, Disp: 180 tablet, Rfl: 1  Review of Systems  Constitutional: Positive for chills and fever.  HENT: Positive for congestion and sore throat.     Social History   Tobacco Use  . Smoking status: Former Smoker    Packs/day: 0.75    Types: Cigarettes    Last attempt to quit: 05/19/2017    Years since quitting: 1.2  . Smokeless tobacco: Former Engineer, water Use Topics  . Alcohol use: No      Objective:   BP 137/88 (BP Location: Left Arm, Patient Position: Sitting, Cuff Size: Normal)   Pulse 99   Temp 99.5 F (37.5 C) (Oral)   Resp 16   Wt 187 lb (84.8 kg)   SpO2 97%   BMI 29.29 kg/m  Vitals:   08/16/18 0841  BP: 137/88  Pulse: 99  Resp: 16  Temp: 99.5 F (37.5 C)  TempSrc: Oral  SpO2: 97%  Weight: 187 lb (84.8 kg)     Physical Exam Constitutional:      General: Ryan Dudley is not in acute distress.    Appearance: Ryan Dudley is well-developed. Ryan Dudley is ill-appearing. Ryan Dudley is not diaphoretic.  HENT:     Right Ear: Tympanic membrane and external ear normal.     Left Ear: Tympanic  membrane and external ear normal.     Nose: Rhinorrhea present.     Right Sinus: No maxillary sinus tenderness or frontal sinus tenderness.     Left Sinus: No maxillary sinus tenderness or frontal sinus tenderness.     Mouth/Throat:     Pharynx: Uvula midline. No oropharyngeal exudate.  Eyes:     General:        Right eye: Discharge present.        Left eye: Discharge present.    Conjunctiva/sclera: Conjunctivae normal.     Comments: Watery Discharge   Neck:     Musculoskeletal: Normal range of motion and neck supple.  Cardiovascular:     Rate and Rhythm: Normal rate and regular rhythm.  Pulmonary:     Effort: Pulmonary effort is normal. No respiratory distress.     Breath sounds: Normal breath sounds. No wheezing or rales.  Lymphadenopathy:     Cervical: Cervical adenopathy present.  Skin:    General: Skin is warm and dry.  Neurological:     Mental Status: Ryan Dudley is alert and oriented to person, place, and time.  Psychiatric:        Mood and  Affect: Mood normal.        Behavior: Behavior normal.         Assessment & Plan    1. Viral URI  Counseled regarding signs and symptoms of viral and bacterial respiratory infections. Advised to call or return for additional evaluation if Ryan Dudley develops any sign of bacterial infection, or if current symptoms last longer than 10 days.    2. Fever, unspecified fever cause  Strep and flu negative.  - POCT Influenza A/B - POCT rapid strep A  The entirety of the information documented in the History of Present Illness, Review of Systems and Physical Exam were personally obtained by me. Portions of this information were initially documented by Rondel Baton, CMA and reviewed by me for thoroughness and accuracy.   Return if symptoms worsen or fail to improve.       Trey Sailors, PA-C  [facility]

## 2018-09-06 ENCOUNTER — Other Ambulatory Visit: Payer: Self-pay | Admitting: Orthopedic Surgery

## 2018-09-06 DIAGNOSIS — S8001XA Contusion of right knee, initial encounter: Secondary | ICD-10-CM

## 2018-09-13 ENCOUNTER — Ambulatory Visit

## 2018-09-27 ENCOUNTER — Ambulatory Visit
Admission: RE | Admit: 2018-09-27 | Discharge: 2018-09-27 | Disposition: A | Discharge status: Home / Self Care | Source: Ambulatory Visit | Attending: Orthopedic Surgery | Admitting: Orthopedic Surgery

## 2018-09-27 ENCOUNTER — Other Ambulatory Visit: Payer: Self-pay

## 2018-09-27 DIAGNOSIS — S8001XA Contusion of right knee, initial encounter: Secondary | ICD-10-CM

## 2019-01-24 DIAGNOSIS — M2391 Unspecified internal derangement of right knee: Secondary | ICD-10-CM | POA: Insufficient documentation

## 2019-01-29 IMAGING — CR DG LUMBAR SPINE COMPLETE 4+V
1 series · 5 of 5 positions shown · non-contrast
Comparison: None.

CLINICAL DATA: 37-year-old male. Mid lower back pain extending to
right side for the past month. Initial encounter.

EXAM:
LUMBAR SPINE - COMPLETE 4+ VIEW

[Series 1: dg lumbar spine complete 4 +v · 0.14mm/px · 5 of 5 slices shown]
[im 1/5]
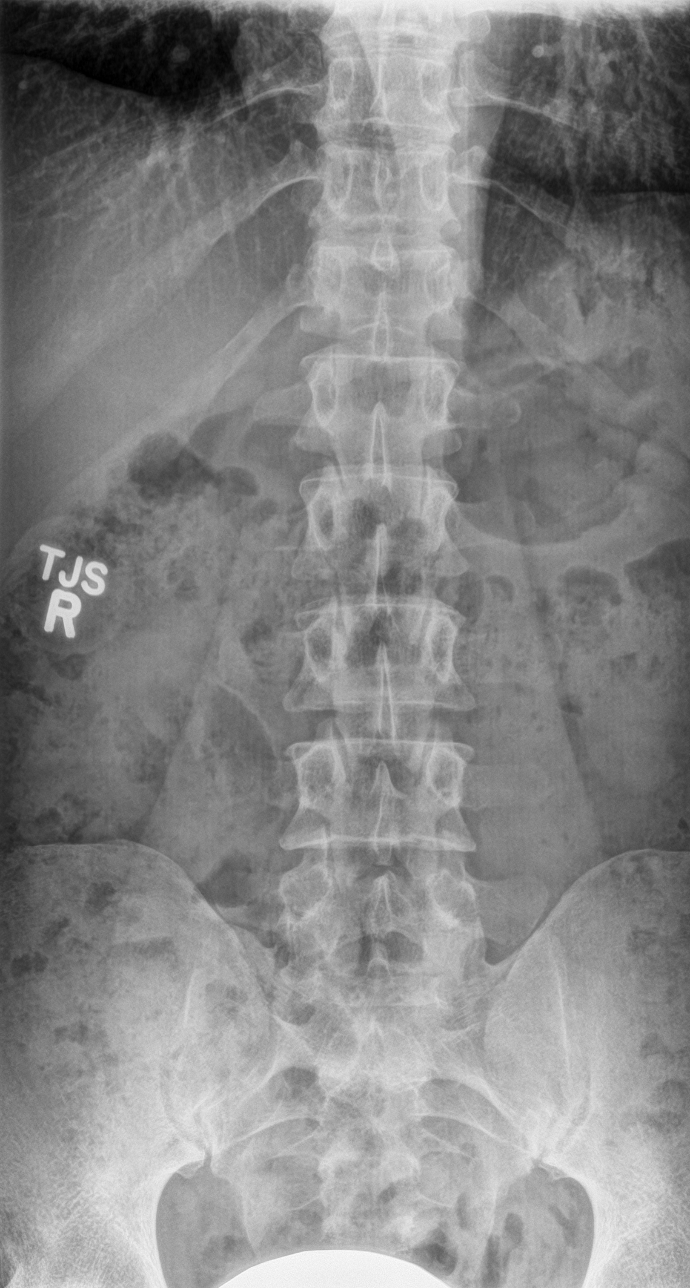
[im 2/5]
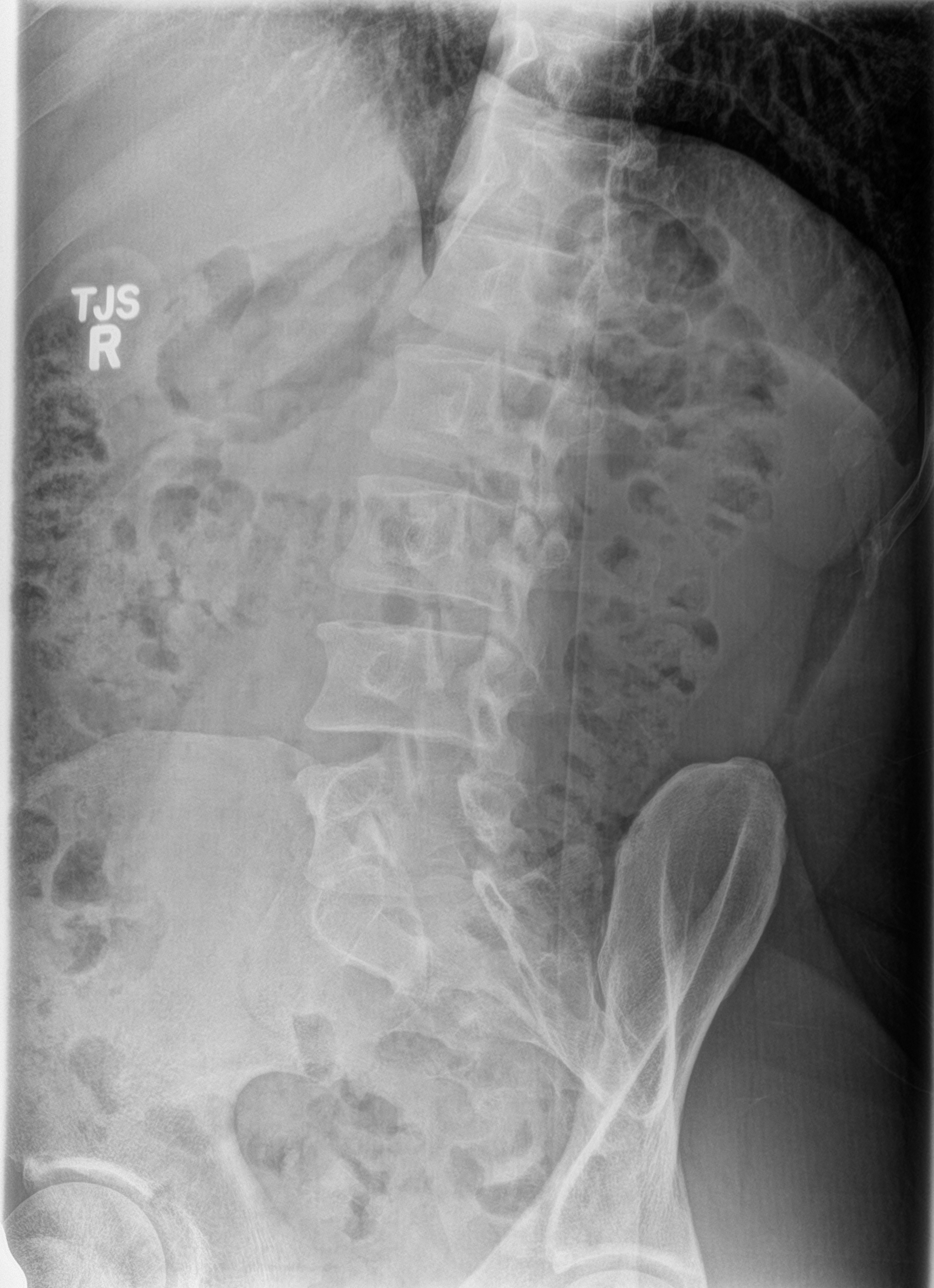
[im 3/5]
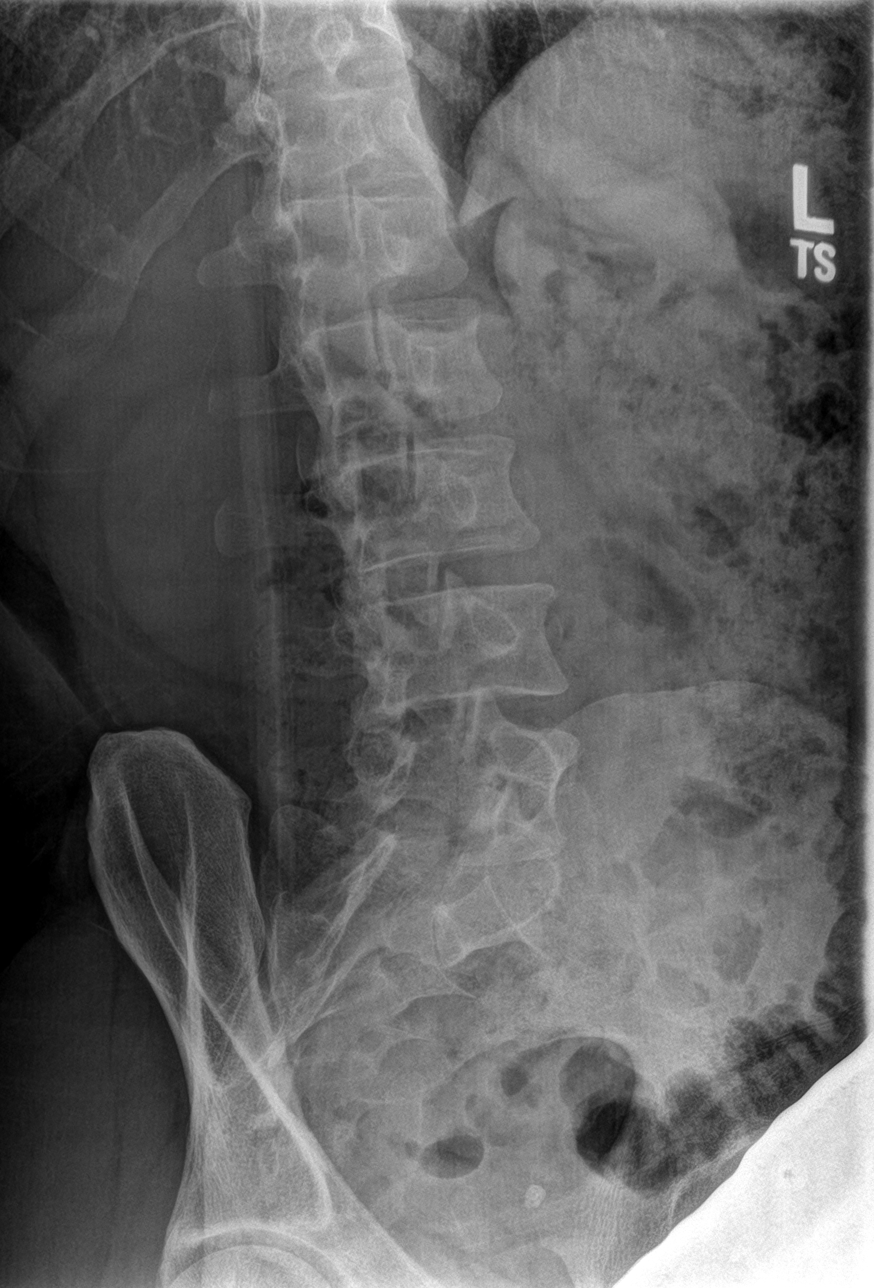
[im 4/5]
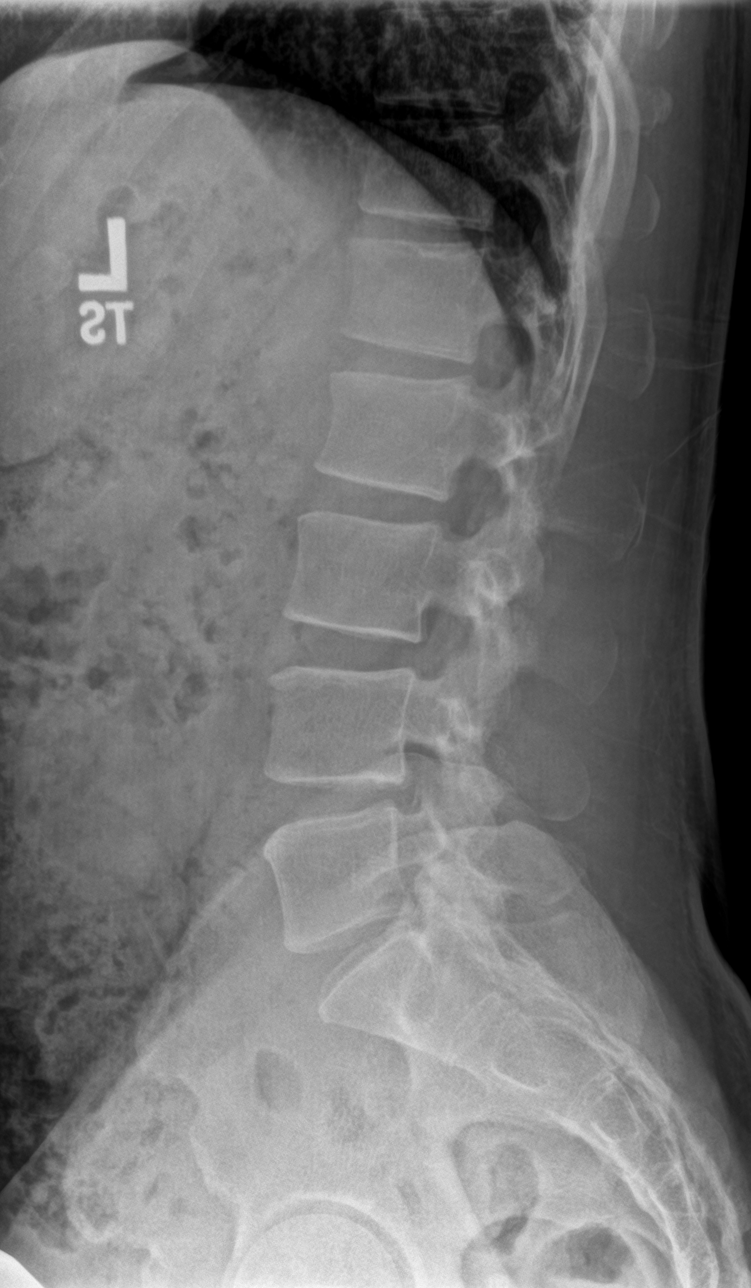
[im 5/5]
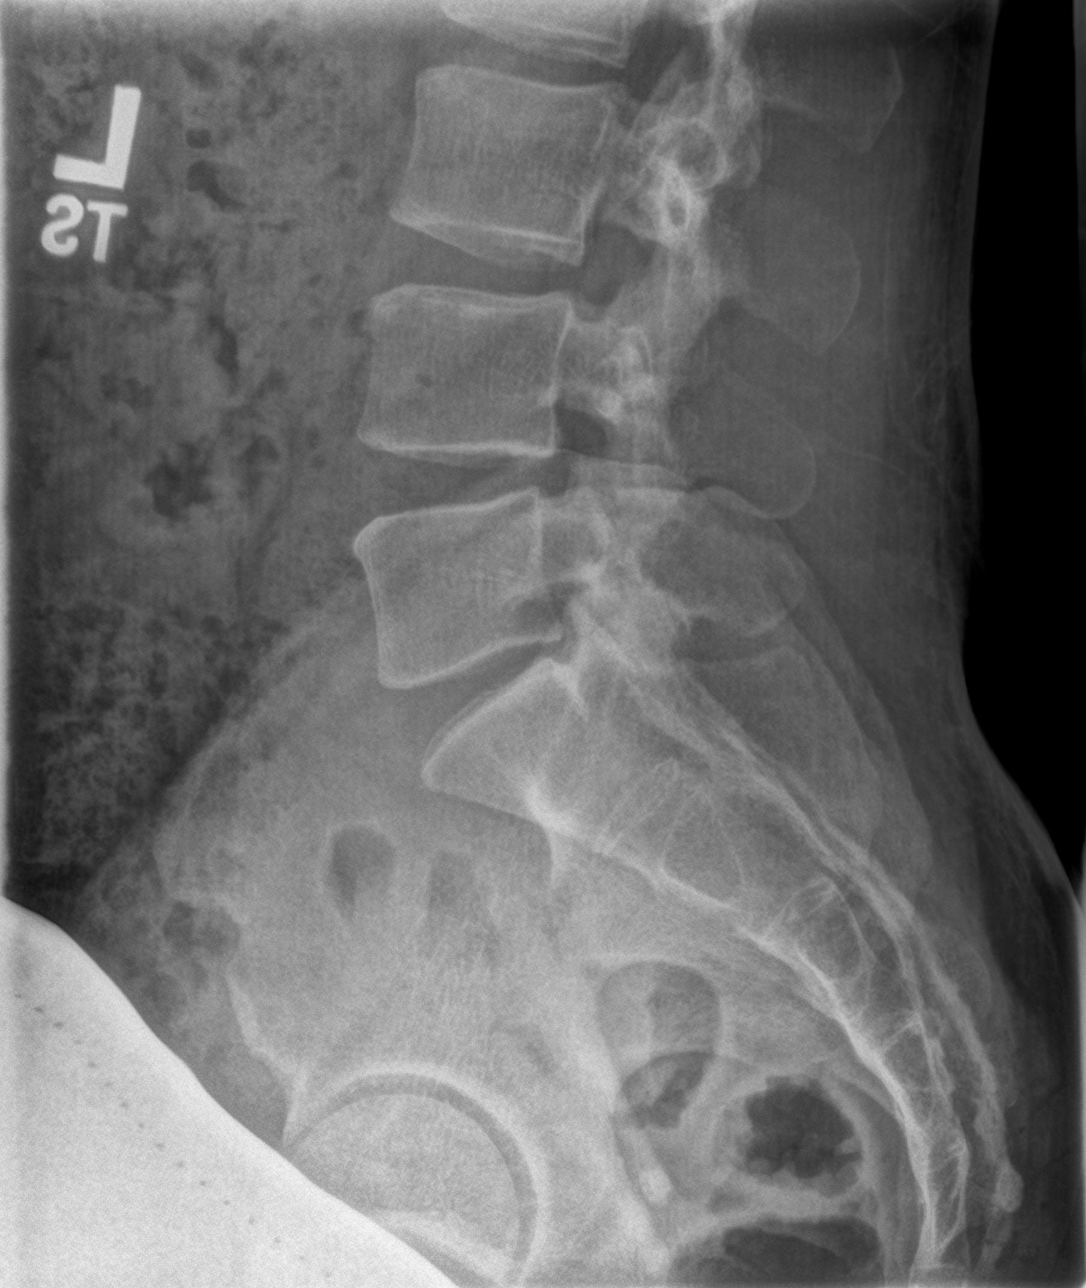

[5 of 5 positions shown; findings below may reference images not displayed]

FINDINGS: Normal alignment. No compression fracture or pars defect. Minimal
L5-S1 disc space narrowing. Minimal Schmorl's node deformity
superior endplate L1. Sacroiliac joints appear intact. Moderate
stool.
IMPRESSION: Minimal L5-S1 disc space narrowing.

## 2019-02-15 NOTE — Progress Notes (Signed)
Patient: Ryan Dudley Male    DOB: Mar 10, 1981   38 y.o.   MRN: [id number] Visit Date: 02/16/2019  Today's Provider: Margaretann LovelessJennifer M Burnette, PA-C   Chief Complaint  Patient presents with  . Anxiety   Subjective:     HPI Patient here today c/o increased anxiety in the last several weeks. Patient reports increased panic attacks in the last several weeks. Patient reports he is not taking bupropion. Started having worsening anxiety and irritability on bupropion.   Reports he has been working from home, his wife has been working, no financial worries, kids are getting ready to go back to school (they go to a private school), so he feels life is well. He is unsure why his anxiety has been increasing recently.   He is Hotel managermilitary, transitioning out, and reports he does have occasional flash backs and thinks of things that happened almost daily. He also reports he has been having difficulty sleeping. Has racing thoughts. Will think of doomsday situations, like someone breaking into their home, will fixate on it and plan all possible scenarios.   He has also had some friends and fellow soldiers that recently have committed suicide and this also has him worried.   He has seen a counselor in the past and reports that it helped him learn coping mechanisms. He does try these when he is feeling anxious and having a panic attack. Sometimes they help. Recently he was at a meeting in [place] with high ranking officers and felt the anxiety building, felt like he just needed to leave. Tried coping mechanisms that would break the anxiety, but then it would return shortly after. Reports during that occurrence it came back 3-4 times.    Allergies  Allergen Reactions  . Ceclor [Cefaclor]     States he was administered this while hospitalized without reaction.     Current Outpatient Medications:  .  buPROPion (WELLBUTRIN SR) 150 MG 12 hr tablet, TAKE 1 TABLET BY MOUTH DAILY FOR 3 DAYS, THEN INCREASE TO  1 TABLET TWICE DAILY AS DIRECTED (Patient not taking: Reported on 02/16/2019), Disp: 180 tablet, Rfl: 1  Review of Systems  Constitutional: Negative.   Respiratory: Negative.   Cardiovascular: Negative.   Neurological: Negative.   Psychiatric/Behavioral: Positive for agitation. The patient is nervous/anxious.     Social History   Tobacco Use  . Smoking status: Former Smoker    Packs/day: 0.75    Types: Cigarettes    Quit date: 05/19/2017    Years since quitting: 1.7  . Smokeless tobacco: Former Engineer, waterUser  Substance Use Topics  . Alcohol use: No      Objective:   BP (!) 142/86 (BP Location: Left Arm, Patient Position: Sitting, Cuff Size: Normal)   Pulse 80   Temp 97.8 F (36.6 C) (Oral)   Resp 16   Ht 5\' 8"  (1.727 m)   Wt 181 lb 12.8 oz (82.5 kg)   BMI 27.64 kg/m  Vitals:   02/16/19 0825  BP: (!) 142/86  Pulse: 80  Resp: 16  Temp: 97.8 F (36.6 C)  TempSrc: Oral  Weight: 181 lb 12.8 oz (82.5 kg)  Height: 5\' 8"  (1.727 m)     Physical Exam Vitals signs reviewed.  Constitutional:      General: He is not in acute distress.    Appearance: Normal appearance. He is well-developed and normal weight.  HENT:     Head: Normocephalic and atraumatic.  Eyes:     General:  No scleral icterus.    Conjunctiva/sclera: Conjunctivae normal.  Neck:     Musculoskeletal: Normal range of motion and neck supple.  Pulmonary:     Effort: Pulmonary effort is normal. No respiratory distress.  Neurological:     Mental Status: He is alert.  Psychiatric:        Attention and Perception: Attention and perception normal.        Mood and Affect: Mood is anxious.        Speech: Speech normal.        Behavior: Behavior normal.        Thought Content: Thought content normal.        Cognition and Memory: Cognition and memory normal.        Judgment: Judgment normal.      Office Visit from 02/16/2019 in [place]  PHQ-9 Total Score  6     GAD 7 : Generalized Anxiety Score  02/16/2019  Nervous, Anxious, on Edge 3  Control/stop worrying 3  Worry too much - different things 2  Trouble relaxing 1  Restless 0  Easily annoyed or irritable 3  Afraid - awful might happen 2  Total GAD 7 Score 14  Anxiety Difficulty Somewhat difficult     No results found for any visits on 02/16/19.     Assessment & Plan    1. GAD (generalized anxiety disorder) Worsening. Will start citalopram as below. Hydroxyzine for prn need. Did discuss using Hydroxyzine before bedtime over the next 1-2 weeks to help get sleep pattern back on track. He agrees. Follow up in 4-6 weeks.  - citalopram (CELEXA) 20 MG tablet; Take 1 tablet (20 mg total) by mouth daily.  Dispense: 30 tablet; Refill: 3 - hydrOXYzine (ATARAX/VISTARIL) 50 MG tablet; Take 1 tablet (50 mg total) by mouth 3 (three) times daily as needed.  Dispense: 30 tablet; Refill: 0  2. BMI 27.0-27.9,adult Counseled patient on healthy lifestyle modifications including dieting and exercise.      Mar Daring, PA-C  [facility]

## 2019-02-16 ENCOUNTER — Ambulatory Visit (INDEPENDENT_AMBULATORY_CARE_PROVIDER_SITE_OTHER): Admitting: Physician Assistant

## 2019-02-16 ENCOUNTER — Other Ambulatory Visit: Payer: Self-pay

## 2019-02-16 ENCOUNTER — Encounter: Payer: Self-pay | Admitting: Physician Assistant

## 2019-02-16 VITALS — BP 142/86 | HR 80 | Temp 97.8°F | Resp 16 | Ht 68.0 in | Wt 181.8 lb

## 2019-02-16 DIAGNOSIS — F411 Generalized anxiety disorder: Secondary | ICD-10-CM | POA: Insufficient documentation

## 2019-02-16 DIAGNOSIS — Z6827 Body mass index (BMI) 27.0-27.9, adult: Secondary | ICD-10-CM

## 2019-02-16 MED ORDER — HYDROXYZINE HCL 50 MG PO TABS: 50.0000 mg | ORAL_TABLET | Freq: Three times a day (TID) | ORAL | 0 refills | Status: DC | PRN

## 2019-02-16 MED ORDER — CITALOPRAM HYDROBROMIDE 20 MG PO TABS: 20.0000 mg | ORAL_TABLET | Freq: Every day | ORAL | 3 refills | Status: DC

## 2019-02-16 NOTE — Patient Instructions (Signed)
Citalopram tablets What is this medicine? CITALOPRAM (sye TAL oh pram) is a medicine for depression. This medicine may be used for other purposes; ask your health care provider or pharmacist if you have questions. COMMON BRAND NAME(S): Celexa What should I tell my health care provider before I take this medicine? They need to know if you have any of these conditions:  bleeding disorders  bipolar disorder or a family history of bipolar disorder  glaucoma  heart disease  history of irregular heartbeat  kidney disease  liver disease  low levels of magnesium or potassium in the blood  receiving electroconvulsive therapy  seizures  suicidal thoughts, plans, or attempt; a previous suicide attempt by you or a family member  take medicines that treat or prevent blood clots  thyroid disease  an unusual or allergic reaction to citalopram, escitalopram, other medicines, foods, dyes, or preservatives  pregnant or trying to become pregnant  breast-feeding How should I use this medicine? Take this medicine by mouth with a glass of water. Follow the directions on the prescription label. You can take it with or without food. Take your medicine at regular intervals. Do not take your medicine more often than directed. Do not stop taking this medicine suddenly except upon the advice of your doctor. Stopping this medicine too quickly may cause serious side effects or your condition may worsen. A special MedGuide will be given to you by the pharmacist with each prescription and refill. Be sure to read this information carefully each time. Talk to your pediatrician regarding the use of this medicine in children. Special care may be needed. Patients over 60 years old may have a stronger reaction and need a smaller dose. Overdosage: If you think you have taken too much of this medicine contact a poison control center or emergency room at once. NOTE: This medicine is only for you. Do not share  this medicine with others. What if I miss a dose? If you miss a dose, take it as soon as you can. If it is almost time for your next dose, take only that dose. Do not take double or extra doses. What may interact with this medicine? Do not take this medicine with any of the following medications:  certain medicines for fungal infections like fluconazole, itraconazole, ketoconazole, posaconazole, voriconazole  cisapride  dronedarone  escitalopram  linezolid  MAOIs like Carbex, Eldepryl, Marplan, Nardil, and Parnate  methylene blue (injected into a vein)  pimozide  thioridazine This medicine may also interact with the following medications:  alcohol  amphetamines  aspirin and aspirin-like medicines  carbamazepine  certain medicines for depression, anxiety, or psychotic disturbances  certain medicines for infections like chloroquine, clarithromycin, erythromycin, furazolidone, isoniazid, pentamidine  certain medicines for migraine headaches like almotriptan, eletriptan, frovatriptan, naratriptan, rizatriptan, sumatriptan, zolmitriptan  certain medicines for sleep  certain medicines that treat or prevent blood clots like dalteparin, enoxaparin, warfarin  cimetidine  diuretics  dofetilide  fentanyl  lithium  methadone  metoprolol  NSAIDs, medicines for pain and inflammation, like ibuprofen or naproxen  omeprazole  other medicines that prolong the QT interval (cause an abnormal heart rhythm)  procarbazine  rasagiline  supplements like St. John's wort, kava kava, valerian  tramadol  tryptophan  ziprasidone This list may not describe all possible interactions. Give your health care provider a list of all the medicines, herbs, non-prescription drugs, or dietary supplements you use. Also tell them if you smoke, drink alcohol, or use illegal drugs. Some items may interact with   your medicine. What should I watch for while using this medicine? Tell your  doctor if your symptoms do not get better or if they get worse. Visit your doctor or health care professional for regular checks on your progress. Because it may take several weeks to see the full effects of this medicine, it is important to continue your treatment as prescribed by your doctor. Patients and their families should watch out for new or worsening thoughts of suicide or depression. Also watch out for sudden changes in feelings such as feeling anxious, agitated, panicky, irritable, hostile, aggressive, impulsive, severely restless, overly excited and hyperactive, or not being able to sleep. If this happens, especially at the beginning of treatment or after a change in dose, call your health care professional. You may get drowsy or dizzy. Do not drive, use machinery, or do anything that needs mental alertness until you know how this medicine affects you. Do not stand or sit up quickly, especially if you are an older patient. This reduces the risk of dizzy or fainting spells. Alcohol may interfere with the effect of this medicine. Avoid alcoholic drinks. Your mouth may get dry. Chewing sugarless gum or sucking hard candy, and drinking plenty of water will help. Contact your doctor if the problem does not go away or is severe. What side effects may I notice from receiving this medicine? Side effects that you should report to your doctor or health care professional as soon as possible:  allergic reactions like skin rash, itching or hives, swelling of the face, lips, or tongue  anxious  black, tarry stools  breathing problems  changes in vision  chest pain  confusion  elevated mood, decreased need for sleep, racing thoughts, impulsive behavior  eye pain  fast, irregular heartbeat  feeling faint or lightheaded, falls  feeling agitated, angry, or irritable  hallucination, loss of contact with reality  loss of balance or coordination  loss of memory  painful or prolonged  erections  restlessness, pacing, inability to keep still  seizures  stiff muscles  suicidal thoughts or other mood changes  trouble sleeping  unusual bleeding or bruising  unusually weak or tired  vomiting Side effects that usually do not require medical attention (report to your doctor or health care professional if they continue or are bothersome):  change in appetite or weight  change in sex drive or performance  dizziness  headache  increased sweating  indigestion, nausea  tremors This list may not describe all possible side effects. Call your doctor for medical advice about side effects. You may report side effects to FDA at 1-800-FDA-1088. Where should I keep my medicine? Keep out of reach of children. Store at room temperature between 15 and 30 degrees C (59 and 86 degrees F). Throw away any unused medicine after the expiration date. NOTE: This sheet is a summary. It may not cover all possible information. If you have questions about this medicine, talk to your doctor, pharmacist, or health care provider.  2020 Elsevier/Gold Standard (2018-06-26 09:05:36) Hydroxyzine capsules or tablets What is this medicine? HYDROXYZINE (hye DROX i zeen) is an antihistamine. This medicine is used to treat allergy symptoms. It is also used to treat anxiety and tension. This medicine can be used with other medicines to induce sleep before surgery. This medicine may be used for other purposes; ask your health care provider or pharmacist if you have questions. COMMON BRAND NAME(S): ANX, Atarax, Rezine, Vistaril What should I tell my health care provider before   I take this medicine? They need to know if you have any of these conditions:  glaucoma  heart disease  history of irregular heartbeat  kidney disease  liver disease  lung or breathing disease, like asthma  stomach or intestine problems  thyroid disease  trouble passing urine  an unusual or allergic reaction  to hydroxyzine, cetirizine, other medicines, foods, dyes or preservatives  pregnant or trying to get pregnant  breast-feeding How should I use this medicine? Take this medicine by mouth with a full glass of water. Follow the directions on the prescription label. You may take this medicine with food or on an empty stomach. Take your medicine at regular intervals. Do not take your medicine more often than directed. Talk to your pediatrician regarding the use of this medicine in children. Special care may be needed. While this drug may be prescribed for children as young as 6 years of age for selected conditions, precautions do apply. Patients over 65 years old may have a stronger reaction and need a smaller dose. Overdosage: If you think you have taken too much of this medicine contact a poison control center or emergency room at once. NOTE: This medicine is only for you. Do not share this medicine with others. What if I miss a dose? If you miss a dose, take it as soon as you can. If it is almost time for your next dose, take only that dose. Do not take double or extra doses. What may interact with this medicine? Do not take this medicine with any of the following medications:  cisapride  dronedarone  pimozide  thioridazine This medicine may also interact with the following medications:  alcohol  antihistamines for allergy, cough, and cold  atropine  barbiturate medicines for sleep or seizures, like phenobarbital  certain antibiotics like erythromycin or clarithromycin  certain medicines for anxiety or sleep  certain medicines for bladder problems like oxybutynin, tolterodine  certain medicines for depression or psychotic disturbances  certain medicines for irregular heart beat  certain medicines for Parkinson's disease like benztropine, trihexyphenidyl  certain medicines for seizures like phenobarbital, primidone  certain medicines for stomach problems like dicyclomine,  hyoscyamine  certain medicines for travel sickness like scopolamine  ipratropium  narcotic medicines for pain  other medicines that prolong the QT interval (an abnormal heart rhythm) like dofetilide This list may not describe all possible interactions. Give your health care provider a list of all the medicines, herbs, non-prescription drugs, or dietary supplements you use. Also tell them if you smoke, drink alcohol, or use illegal drugs. Some items may interact with your medicine. What should I watch for while using this medicine? Tell your doctor or health care professional if your symptoms do not improve. You may get drowsy or dizzy. Do not drive, use machinery, or do anything that needs mental alertness until you know how this medicine affects you. Do not stand or sit up quickly, especially if you are an older patient. This reduces the risk of dizzy or fainting spells. Alcohol may interfere with the effect of this medicine. Avoid alcoholic drinks. Your mouth may get dry. Chewing sugarless gum or sucking hard candy, and drinking plenty of water may help. Contact your doctor if the problem does not go away or is severe. This medicine may cause dry eyes and blurred vision. If you wear contact lenses you may feel some discomfort. Lubricating drops may help. See your eye doctor if the problem does not go away or is severe. If   you are receiving skin tests for allergies, tell your doctor you are using this medicine. What side effects may I notice from receiving this medicine? Side effects that you should report to your doctor or health care professional as soon as possible:  allergic reactions like skin rash, itching or hives, swelling of the face, lips, or tongue  changes in vision  confusion  fast, irregular heartbeat  seizures  tremor  trouble passing urine or change in the amount of urine Side effects that usually do not require medical attention (report to your doctor or health care  professional if they continue or are bothersome):  constipation  drowsiness  dry mouth  headache  tiredness This list may not describe all possible side effects. Call your doctor for medical advice about side effects. You may report side effects to FDA at 1-800-FDA-1088. Where should I keep my medicine? Keep out of the reach of children. Store at room temperature between 15 and 30 degrees C (59 and 86 degrees F). Keep container tightly closed. Throw away any unused medicine after the expiration date. NOTE: This sheet is a summary. It may not cover all possible information. If you have questions about this medicine, talk to your doctor, pharmacist, or health care provider.  2020 Elsevier/Gold Standard (2018-06-26 13:19:55)  

## 2019-03-05 ENCOUNTER — Other Ambulatory Visit: Payer: Self-pay | Admitting: Physician Assistant

## 2019-03-05 DIAGNOSIS — F411 Generalized anxiety disorder: Secondary | ICD-10-CM

## 2019-03-05 MED ORDER — HYDROXYZINE HCL 50 MG PO TABS: 50.0000 mg | ORAL_TABLET | Freq: Three times a day (TID) | ORAL | 0 refills | Status: DC | PRN

## 2019-03-05 NOTE — Telephone Encounter (Signed)
Pt needing a refill on:  hydrOXYzine (ATARAX/VISTARIL) 50 MG tablet  Please fill at:  [id number] [address], [place] [phone] (Phone) [phone] (Fax)   Thanks, [place]

## 2019-03-14 ENCOUNTER — Other Ambulatory Visit: Payer: Self-pay

## 2019-03-14 ENCOUNTER — Encounter: Payer: Self-pay | Admitting: Physician Assistant

## 2019-03-14 ENCOUNTER — Ambulatory Visit (INDEPENDENT_AMBULATORY_CARE_PROVIDER_SITE_OTHER): Admitting: Physician Assistant

## 2019-03-14 VITALS — BP 138/83 | HR 85 | Temp 96.6°F | Resp 16 | Ht 67.0 in | Wt 182.0 lb

## 2019-03-14 DIAGNOSIS — M25561 Pain in right knee: Secondary | ICD-10-CM

## 2019-03-14 DIAGNOSIS — F411 Generalized anxiety disorder: Secondary | ICD-10-CM

## 2019-03-14 DIAGNOSIS — N529 Male erectile dysfunction, unspecified: Secondary | ICD-10-CM

## 2019-03-14 MED ORDER — SILDENAFIL CITRATE 100 MG PO TABS: 50.0000 mg | ORAL_TABLET | Freq: Every day | ORAL | 11 refills | Status: DC | PRN

## 2019-03-14 NOTE — Patient Instructions (Addendum)
Call around and ask cash price [facility] in [place], [place] is a mail order pharmacy with good cash prices   Call surgery about pain management and if you can't get in touch with surgery, I will give you a five day supply to get you until your next appointment

## 2019-03-14 NOTE — Progress Notes (Signed)
Patient: Ryan Dudley Male    DOB: [birth year]   38 y.o.   MRN: [id number] Visit Date: 03/14/2019  Today's Provider: Trinna Post, PA-C   Chief Complaint  Patient presents with  . Follow-up   Subjective:     HPI  Plans on transitioning out of the [place] this September and working with [employer].   GAD (generalized anxiety disorder) From 02/16/2019-started citalopram 20 mg qd. Hydroxyzine for prn need. He reports this is greatly helping his anxiety and he has not had a panic attack since starting this medication.    BMI 27.0-27.9,adult From 02/16/2019-Counseled patient on healthy lifestyle modifications including dieting and exercise.   02/20/2019 patient underwent arthroscopic surgery with Dr. Marlou Dudley at [facility]. Initially, he had been referred with right knee pain to Dr. Donney Dudley at [facility]. 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 Ultiamtely he was seen at [facility] and recommended to have surgery, which he did on 02/20/2019. Reports initial bruising and swelling has gone down. He has been out of pain medication for a week and has been using ibuprofen very frequently. Had a script written for oxycodone 5 mg #20 and was taking 1 in the afternoon and 1 at night. Has not yet contacted his surgeon's office. Follow up scheduled 03/19/2019 with orthopedics.   Reports some recent erectile dysfunction in the past 2-3 months. This has gotten somewhat worse with celexa. Has not had this issue before.   Allergies  Allergen Reactions  . Ceclor [Cefaclor]     States he was administered this while hospitalized without reaction.     Current Outpatient Medications:  .  citalopram (CELEXA) 20 MG tablet, Take 1 tablet (20 mg total) by mouth daily., Disp: 30 tablet, Rfl: 3 .  hydrOXYzine (ATARAX/VISTARIL) 50 MG tablet, Take 1 tablet (50 mg total) by mouth 3 (three) times daily as needed., Disp: 30 tablet, Rfl: 0  Review of Systems  Constitutional: Negative  for appetite change, chills and fever.  Respiratory: Negative for chest tightness, shortness of breath and wheezing.   Cardiovascular: Negative for chest pain and palpitations.  Gastrointestinal: Negative for abdominal pain, nausea and vomiting.    Social History   Tobacco Use  . Smoking status: Former Smoker    Packs/day: 0.75    Types: Cigarettes    Quit date: 05/19/2017    Years since quitting: 1.8  . Smokeless tobacco: Former Network engineer Use Topics  . Alcohol use: No      Objective:   BP 138/83 (BP Location: Right Arm, Patient Position: Sitting, Cuff Size: Large)   Pulse 85   Temp (!) 96.6 F (35.9 C) (Other (Comment))   Resp 16   Ht 5\' 7"  (1.702 m)   Wt 182 lb (82.6 kg)   SpO2 97%   BMI 28.51 kg/m  Vitals:   03/14/19 1451  BP: 138/83  Pulse: 85  Resp: 16  Temp: (!) 96.6 F (35.9 C)  TempSrc: Other (Comment)  SpO2: 97%  Weight: 182 lb (82.6 kg)  Height: 5\' 7"  (1.702 m)     Physical Exam Constitutional:      Appearance: Normal appearance.  Cardiovascular:     Rate and Rhythm: Normal rate.  Pulmonary:     Effort: Pulmonary effort is normal. No respiratory distress.  Musculoskeletal:     Comments: Right knee in ace bandage   Skin:    General: Skin is warm and dry.  Neurological:  Mental Status: He is alert and oriented to person, place, and time. Mental status is at baseline.  Psychiatric:        Mood and Affect: Mood normal.        Behavior: Behavior normal.      No results found for any visits on 03/14/19.     Assessment & Plan    1. Right knee pain, unspecified chronicity  S/p arthroscopy. I have advised him to contact his surgeon's office for additional pain medication and so that they may assess if his pain is expected at this point in the recovery process. If he is unable to get in contact with them, I will provide a five day prescription of pain medication until his follow up appointment with sports medicine.  2. GAD (generalized  anxiety disorder)  Doing well with celexa and hydroxyzine, reports panic attacks have decreased.   3. Erectile dysfunction, unspecified erectile dysfunction type  New onset in the past few months. Will check as below, trial of medication. Discussed this is not covered by insurances and have suggested calling to pharmacies and asking cash price as well as checking with [facility] [place].   - Testosterone,Free and Total - sildenafil (VIAGRA) 100 MG tablet; Take 0.5-1 tablets (50-100 mg total) by mouth daily as needed for erectile dysfunction.  Dispense: 5 tablet; Refill: 11  The entirety of the information documented in the History of Present Illness, Review of Systems and Physical Exam were personally obtained by me. Portions of this information were initially documented by Ryan Dudley, CMA and reviewed by me for thoroughness and accuracy.       Ryan SailorsAdriana M Infant Doane, PA-C  [facility]

## 2019-03-16 ENCOUNTER — Telehealth: Payer: Self-pay

## 2019-03-16 DIAGNOSIS — M25561 Pain in right knee: Secondary | ICD-10-CM

## 2019-03-16 MED ORDER — OXYCODONE-ACETAMINOPHEN 5-325 MG PO TABS: 1.0000 | ORAL_TABLET | Freq: Two times a day (BID) | ORAL | 0 refills | Status: AC

## 2019-03-16 NOTE — Telephone Encounter (Signed)
Patient was advised.  

## 2019-03-16 NOTE — Telephone Encounter (Signed)
I've sent in oxycodone-acetaminophen 5-325 mg one tablet twice daily for a total of five days. Be sure to follow up with orthopedics on the 31st and let them know about pain and pain management needs.

## 2019-03-16 NOTE — Telephone Encounter (Signed)
Patient called office requesting refill on Oxycodone. Patient states that he wanted to update Adriana and let her know that he has not heard back from surgeons office after reaching out to them and leaving voice messages, he states that he has an appointment scheduled with you on 31st, patient asked in pain script can be sent to [facility] drug store on [address]

## 2019-03-20 ENCOUNTER — Other Ambulatory Visit: Payer: Self-pay | Admitting: Physician Assistant

## 2019-03-20 DIAGNOSIS — F411 Generalized anxiety disorder: Secondary | ICD-10-CM

## 2019-03-20 MED ORDER — HYDROXYZINE HCL 50 MG PO TABS: 50.0000 mg | ORAL_TABLET | Freq: Three times a day (TID) | ORAL | 0 refills | Status: DC | PRN

## 2019-03-20 NOTE — Telephone Encounter (Signed)
Pt needs a refill on Hydroxyzine 50 mg  [facility] [address]  [place]

## 2019-03-30 ENCOUNTER — Ambulatory Visit (INDEPENDENT_AMBULATORY_CARE_PROVIDER_SITE_OTHER): Admitting: Physician Assistant

## 2019-03-30 ENCOUNTER — Encounter: Payer: Self-pay | Admitting: Physician Assistant

## 2019-03-30 ENCOUNTER — Other Ambulatory Visit: Payer: Self-pay

## 2019-03-30 VITALS — BP 122/80 | HR 76 | Temp 96.8°F | Wt 182.0 lb

## 2019-03-30 DIAGNOSIS — F411 Generalized anxiety disorder: Secondary | ICD-10-CM | POA: Diagnosis not present

## 2019-03-30 NOTE — Progress Notes (Signed)
Patient: Ryan Dudley Male    DOB: 18-Mar-1981   38 y.o.   MRN: [id number] Visit Date: 03/30/2019  Today's Provider: Trinna Post, PA-C   Chief Complaint  Patient presents with  . Anxiety   Subjective:      Anxiety Presents for follow-up (Pt started citalopram and hydoxyzine on 02/16/2019.  Pt reports feeling 100% better. ) visit. Symptoms include decreased concentration, excessive worry and nervous/anxious behavior. Patient reports no confusion, depressed mood, dizziness, dry mouth, insomnia, malaise, nausea, panic (Small one on Monday), restlessness, shortness of breath or suicidal ideas. The quality of sleep is good. Nighttime awakenings: none.     Still doing well on Celexa since starting it one month ago. Had a mini panic attack driving down to [facility]. Still continuing with physical therapy for knee. Taking celexa to night time to deal with erectile dysfunction. Did use sildenafil and it was helpful but last couple times he has not needed it.   Currently working towards Manufacturing engineer for [employer]   Allergies  Allergen Reactions  . Ceclor [Cefaclor]     States he was administered this while hospitalized without reaction.     Current Outpatient Medications:  .  citalopram (CELEXA) 20 MG tablet, Take 1 tablet (20 mg total) by mouth daily., Disp: 30 tablet, Rfl: 3 .  hydrOXYzine (ATARAX/VISTARIL) 50 MG tablet, Take 1 tablet (50 mg total) by mouth 3 (three) times daily as needed., Disp: 30 tablet, Rfl: 0 .  sildenafil (VIAGRA) 100 MG tablet, Take 0.5-1 tablets (50-100 mg total) by mouth daily as needed for erectile dysfunction., Disp: 5 tablet, Rfl: 11  Review of Systems  Constitutional: Negative.   Respiratory: Negative.  Negative for shortness of breath.   Cardiovascular: Negative.   Gastrointestinal: Negative.  Negative for nausea.  Neurological: Negative for dizziness, light-headedness and headaches.  Psychiatric/Behavioral: Positive for  decreased concentration. Negative for agitation, behavioral problems, confusion, dysphoric mood, hallucinations, self-injury, sleep disturbance and suicidal ideas. The patient is nervous/anxious. The patient does not have insomnia and is not hyperactive.     Social History   Tobacco Use  . Smoking status: Former Smoker    Packs/day: 0.75    Types: Cigarettes    Quit date: 05/19/2017    Years since quitting: 1.8  . Smokeless tobacco: Former Network engineer Use Topics  . Alcohol use: No      Objective:   BP 122/80 (BP Location: Right Arm, Patient Position: Sitting, Cuff Size: Normal)   Pulse 76   Temp (!) 96.8 F (36 C) (Temporal)   Wt 182 lb (82.6 kg)   BMI 28.51 kg/m  Vitals:   03/30/19 0821  BP: 122/80  Pulse: 76  Temp: (!) 96.8 F (36 C)  TempSrc: Temporal  Weight: 182 lb (82.6 kg)  Body mass index is 28.51 kg/m.   Physical Exam Constitutional:      Appearance: Normal appearance.  Cardiovascular:     Rate and Rhythm: Normal rate.  Pulmonary:     Effort: Pulmonary effort is normal.  Skin:    General: Skin is warm and dry.  Neurological:     Mental Status: He is alert and oriented to person, place, and time. Mental status is at baseline.  Psychiatric:        Mood and Affect: Mood normal.        Behavior: Behavior normal.      No results found for any visits on 03/30/19.  Assessment & Plan    1. GAD (generalized anxiety disorder)  Continue celexa, doing well on this. Sexual side effects have decreased when changing celexa to night time dosing. Follow up one year. 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   The entirety of the information documented in the History of Present Illness, Review of Systems and Physical Exam were personally obtained by me. Portions of this information were initially documented by Kavin LeechLaura Walsh, CMA and reviewed by me for thoroughness and accuracy.           Trey SailorsAdriana M Pollak, PA-C  [facility]  [facility]

## 2019-03-30 NOTE — Patient Instructions (Signed)

## 2019-04-04 ENCOUNTER — Other Ambulatory Visit: Payer: Self-pay | Admitting: Physician Assistant

## 2019-04-04 DIAGNOSIS — F411 Generalized anxiety disorder: Secondary | ICD-10-CM

## 2019-04-04 MED ORDER — HYDROXYZINE HCL 50 MG PO TABS: 50.0000 mg | ORAL_TABLET | Freq: Three times a day (TID) | ORAL | 0 refills | Status: DC | PRN

## 2019-04-04 NOTE — Telephone Encounter (Signed)
[facility] faxed refill request for the following medications:  hydrOXYzine (ATARAX/VISTARIL) 50 MG tablet   Please advise.  

## 2019-04-16 DIAGNOSIS — Z9889 Other specified postprocedural states: Secondary | ICD-10-CM | POA: Insufficient documentation

## 2019-04-17 ENCOUNTER — Other Ambulatory Visit: Payer: Self-pay | Admitting: *Deleted

## 2019-04-17 DIAGNOSIS — F411 Generalized anxiety disorder: Secondary | ICD-10-CM

## 2019-04-17 MED ORDER — HYDROXYZINE HCL 50 MG PO TABS: 50.0000 mg | ORAL_TABLET | Freq: Three times a day (TID) | ORAL | 0 refills | Status: DC | PRN

## 2019-04-18 ENCOUNTER — Other Ambulatory Visit: Payer: Self-pay | Admitting: Physician Assistant

## 2019-04-18 DIAGNOSIS — F411 Generalized anxiety disorder: Secondary | ICD-10-CM

## 2019-04-18 MED ORDER — CITALOPRAM HYDROBROMIDE 20 MG PO TABS: 20.0000 mg | ORAL_TABLET | Freq: Every day | ORAL | 3 refills | Status: DC

## 2019-04-18 NOTE — Telephone Encounter (Signed)
Requesting refill of citalopram (CELEXA) 20 MG tablet sent to [facility] on [address].

## 2019-05-02 ENCOUNTER — Other Ambulatory Visit: Payer: Self-pay

## 2019-05-02 DIAGNOSIS — F411 Generalized anxiety disorder: Secondary | ICD-10-CM

## 2019-05-02 MED ORDER — HYDROXYZINE HCL 50 MG PO TABS: 50.0000 mg | ORAL_TABLET | Freq: Three times a day (TID) | ORAL | 0 refills | Status: DC | PRN

## 2019-05-02 NOTE — Telephone Encounter (Signed)
Patient request refill

## 2019-05-15 ENCOUNTER — Other Ambulatory Visit: Payer: Self-pay | Admitting: Physician Assistant

## 2019-05-15 DIAGNOSIS — F411 Generalized anxiety disorder: Secondary | ICD-10-CM

## 2019-05-15 MED ORDER — CITALOPRAM HYDROBROMIDE 20 MG PO TABS: 20.0000 mg | ORAL_TABLET | Freq: Every day | ORAL | 3 refills | Status: DC

## 2019-05-15 MED ORDER — HYDROXYZINE HCL 50 MG PO TABS: 50.0000 mg | ORAL_TABLET | Freq: Three times a day (TID) | ORAL | 1 refills | Status: DC | PRN

## 2019-05-15 NOTE — Telephone Encounter (Signed)
Pt needing refills on:  citalopram (CELEXA) 20 MG tablet hydrOXYzine (ATARAX/VISTARIL) 50 MG tablet  Please fill at:  [id number] [address], [place] [phone] (Phone) [phone] (Fax)   Thanks, [place]

## 2019-06-06 ENCOUNTER — Ambulatory Visit (INDEPENDENT_AMBULATORY_CARE_PROVIDER_SITE_OTHER): Admitting: Physician Assistant

## 2019-06-06 ENCOUNTER — Other Ambulatory Visit: Payer: Self-pay

## 2019-06-06 ENCOUNTER — Encounter: Payer: Self-pay | Admitting: Physician Assistant

## 2019-06-06 VITALS — BP 136/84 | HR 77 | Temp 96.6°F

## 2019-06-06 DIAGNOSIS — R5383 Other fatigue: Secondary | ICD-10-CM

## 2019-06-06 DIAGNOSIS — Z13220 Encounter for screening for lipoid disorders: Secondary | ICD-10-CM | POA: Diagnosis not present

## 2019-06-06 DIAGNOSIS — R29818 Other symptoms and signs involving the nervous system: Secondary | ICD-10-CM

## 2019-06-06 NOTE — Patient Instructions (Signed)
Health Maintenance, Male °Adopting a healthy lifestyle and getting preventive care are important in promoting health and wellness. Ask your health care provider about: °· The right schedule for you to have regular tests and exams. °· Things you can do on your own to prevent diseases and keep yourself healthy. °What should I know about diet, weight, and exercise? °Eat a healthy diet ° °· Eat a diet that includes plenty of vegetables, fruits, low-fat dairy products, and lean protein. °· Do not eat a lot of foods that are high in solid fats, added sugars, or sodium. °Maintain a healthy weight °Body mass index (BMI) is a measurement that can be used to identify possible weight problems. It estimates body fat based on height and weight. Your health care provider can help determine your BMI and help you achieve or maintain a healthy weight. °Get regular exercise °Get regular exercise. This is one of the most important things you can do for your health. Most adults should: °· Exercise for at least 150 minutes each week. The exercise should increase your heart rate and make you sweat (moderate-intensity exercise). °· Do strengthening exercises at least twice a week. This is in addition to the moderate-intensity exercise. °· Spend less time sitting. Even light physical activity can be beneficial. °Watch cholesterol and blood lipids °Have your blood tested for lipids and cholesterol at 36 years of age, then have this test every 5 years. °You may need to have your cholesterol levels checked more often if: °· Your lipid or cholesterol levels are high. °· You are older than 36 years of age. °· You are at high risk for heart disease. °What should I know about cancer screening? °Many types of cancers can be detected early and may often be prevented. Depending on your health history and family history, you may need to have cancer screening at various ages. This may include screening for: °· Colorectal cancer. °· Prostate  cancer. °· Skin cancer. °· Lung cancer. °What should I know about heart disease, diabetes, and high blood pressure? °Blood pressure and heart disease °· High blood pressure causes heart disease and increases the risk of stroke. This is more likely to develop in people who have high blood pressure readings, are of African descent, or are overweight. °· Talk with your health care provider about your target blood pressure readings. °· Have your blood pressure checked: °? Every 3-5 years if you are 18-39 years of age. °? Every year if you are 40 years old or older. °· If you are between the ages of 65 and 75 and are a current or former smoker, ask your health care provider if you should have a one-time screening for abdominal aortic aneurysm (AAA). °Diabetes °Have regular diabetes screenings. This checks your fasting blood sugar level. Have the screening done: °· Once every three years after age 45 if you are at a normal weight and have a low risk for diabetes. °· More often and at a younger age if you are overweight or have a high risk for diabetes. °What should I know about preventing infection? °Hepatitis B °If you have a higher risk for hepatitis B, you should be screened for this virus. Talk with your health care provider to find out if you are at risk for hepatitis B infection. °Hepatitis C °Blood testing is recommended for: °· Everyone born from 1945 through 1965. °· Anyone with known risk factors for hepatitis C. °Sexually transmitted infections (STIs) °· You should be screened each year   for STIs, including gonorrhea and chlamydia, if: °? You are sexually active and are younger than 36 years of age. °? You are older than 36 years of age and your health care provider tells you that you are at risk for this type of infection. °? Your sexual activity has changed since you were last screened, and you are at increased risk for chlamydia or gonorrhea. Ask your health care provider if you are at risk. °· Ask your  health care provider about whether you are at high risk for HIV. Your health care provider may recommend a prescription medicine to help prevent HIV infection. If you choose to take medicine to prevent HIV, you should first get tested for HIV. You should then be tested every 3 months for as long as you are taking the medicine. °Follow these instructions at home: °Lifestyle °· Do not use any products that contain nicotine or tobacco, such as cigarettes, e-cigarettes, and chewing tobacco. If you need help quitting, ask your health care provider. °· Do not use street drugs. °· Do not share needles. °· Ask your health care provider for help if you need support or information about quitting drugs. °Alcohol use °· Do not drink alcohol if your health care provider tells you not to drink. °· If you drink alcohol: °? Limit how much you have to 0-2 drinks a day. °? Be aware of how much alcohol is in your drink. In the [place] one drink equals one 12 oz bottle of beer (355 mL), one 5 oz glass of wine (148 mL), or one 1½ oz glass of hard liquor (44 mL). °General instructions °· Schedule regular health, dental, and eye exams. °· Stay current with your vaccines. °· Tell your health care provider if: °? You often feel depressed. °? You have ever been abused or do not feel safe at home. °Summary °· Adopting a healthy lifestyle and getting preventive care are important in promoting health and wellness. °· Follow your health care provider's instructions about healthy diet, exercising, and getting tested or screened for diseases. °· Follow your health care provider's instructions on monitoring your cholesterol and blood pressure. °This information is not intended to replace advice given to you by your health care provider. Make sure you discuss any questions you have with your health care provider. °Document Released: 01/01/2008 Document Revised: 06/28/2018 Document Reviewed: 06/28/2018 °Elsevier Patient Education © 2020 Elsevier  Inc. ° °

## 2019-06-06 NOTE — Progress Notes (Signed)
Patient: Ryan Dudley Male    DOB: [birth year]   38 y.o.   MRN: [id number] Visit Date: 06/06/2019  Today's Provider: Trinna Post, PA-C   Chief Complaint  Patient presents with  . Anxiety   Subjective:     HPI    Anxiety  Patient presents today for anxiety follow-up. Patient last office visit was on 03/30/2019. Patient is currently taking Celexa. Patient states that he has been tired a lot lately and forgetful. Was taking hydroxyzine three times a day and then scaled back to twice daily. He reports he has no energy and it is very difficult to get out of bed in the morning.   He doesn't report snoring at night. He reports he feels drowsy during the day. Reports he goes back to sleep very easily. Wife reports he snores inconsistently.   GAD 7 : Generalized Anxiety Score 06/06/2019 02/16/2019  Nervous, Anxious, on Edge 3 3  Control/stop worrying 3 3  Worry too much - different things 3 2  Trouble relaxing 3 1  Restless 0 0  Easily annoyed or irritable 3 3  Afraid - awful might happen 1 2  Total GAD 7 Score 16 14  Anxiety Difficulty Very difficult Somewhat difficult   Results of the Epworth flowsheet 06/06/2019  Sitting and reading 3  Watching TV 2  Sitting, inactive in a public place (e.g. a theatre or a meeting) 2  As a passenger in a car for an hour without a break 2  Lying down to rest in the afternoon when circumstances permit 3  Sitting and talking to someone 0  Sitting quietly after a lunch without alcohol 2  In a car, while stopped for a few minutes in traffic 1  Total score 15      Wt Readings from Last 3 Encounters:  03/30/19 182 lb (82.6 kg)  03/14/19 182 lb (82.6 kg)  02/16/19 181 lb 12.8 oz (82.5 kg)       Allergies  Allergen Reactions  . Ceclor [Cefaclor]     States he was administered this while hospitalized without reaction.     Current Outpatient Medications:  .  citalopram (CELEXA) 20 MG tablet, Take 1 tablet (20 mg total) by  mouth daily., Disp: 30 tablet, Rfl: 3 .  hydrOXYzine (ATARAX/VISTARIL) 50 MG tablet, Take 1 tablet (50 mg total) by mouth 3 (three) times daily as needed., Disp: 30 tablet, Rfl: 1 .  sildenafil (VIAGRA) 100 MG tablet, Take 0.5-1 tablets (50-100 mg total) by mouth daily as needed for erectile dysfunction., Disp: 5 tablet, Rfl: 11  Review of Systems  Social History   Tobacco Use  . Smoking status: Former Smoker    Packs/day: 0.75    Types: Cigarettes    Quit date: 05/19/2017    Years since quitting: 2.0  . Smokeless tobacco: Former Network engineer Use Topics  . Alcohol use: No      Objective:   BP 136/84 (BP Location: Left Arm, Patient Position: Sitting, Cuff Size: Normal)   Pulse 77   Temp (!) 96.6 F (35.9 C) (Temporal)  Vitals:   06/06/19 0950  BP: 136/84  Pulse: 77  Temp: (!) 96.6 F (35.9 C)  TempSrc: Temporal  There is no height or weight on file to calculate BMI.   Physical Exam Constitutional:      Appearance: Normal appearance.  Cardiovascular:     Rate and Rhythm: Normal rate and regular rhythm.  Heart sounds: Normal heart sounds.  Pulmonary:     Effort: Pulmonary effort is normal.     Breath sounds: Normal breath sounds.  Skin:    General: Skin is warm and dry.  Neurological:     Mental Status: He is alert and oriented to person, place, and time. Mental status is at baseline.  Psychiatric:        Mood and Affect: Mood normal.        Behavior: Behavior normal.     Results of the Epworth flowsheet 06/06/2019  Sitting and reading 3  Watching TV 2  Sitting, inactive in a public place (e.g. a theatre or a meeting) 2  As a passenger in a car for an hour without a break 2  Lying down to rest in the afternoon when circumstances permit 3  Sitting and talking to someone 0  Sitting quietly after a lunch without alcohol 2  In a car, while stopped for a few minutes in traffic 1  Total score 15     No results found for any visits on 06/06/19.      Assessment & Plan    1. Other fatigue   We will check labs as below. We will have him complete epworth and order home sleep study to explore that option as well. Will keep celexa same dose for now.   - CBC with Differential - Testosterone,Free and Total - Comprehensive Metabolic Panel (CMET) - TSH  2. Suspected sleep apnea  - Home sleep test  3. Lipid screening  - Lipid Profile  The entirety of the information documented in the History of Present Illness, Review of Systems and Physical Exam were personally obtained by me. Portions of this information were initially documented by [facility], CMA and reviewed by me for thoroughness and accuracy.          Trey Sailors, PA-C  [facility]

## 2019-06-08 ENCOUNTER — Telehealth: Payer: Self-pay

## 2019-06-08 LAB — COMPREHENSIVE METABOLIC PANEL
ALT: 53 IU/L — ABNORMAL HIGH (ref 0–44)
AST: 35 IU/L (ref 0–40)
Albumin/Globulin Ratio: 2.3 — ABNORMAL HIGH (ref 1.2–2.2)
Albumin: 4.6 g/dL (ref 4.0–5.0)
Alkaline Phosphatase: 77 IU/L (ref 39–117)
BUN/Creatinine Ratio: 12 (ref 9–20)
BUN: 13 mg/dL (ref 6–20)
Bilirubin Total: 0.6 mg/dL (ref 0.0–1.2)
CO2: 24 mmol/L (ref 20–29)
Calcium: 9.6 mg/dL (ref 8.7–10.2)
Chloride: 103 mmol/L (ref 96–106)
Creatinine, Ser: 1.07 mg/dL (ref 0.76–1.27)
GFR calc Af Amer: 103 mL/min/{1.73_m2} (ref >59)
GFR calc non Af Amer: 89 mL/min/{1.73_m2} (ref >59)
Globulin, Total: 2 g/dL (ref 1.5–4.5)
Glucose: 85 mg/dL (ref 65–99)
Potassium: 4.4 mmol/L (ref 3.5–5.2)
Sodium: 143 mmol/L (ref 134–144)
Total Protein: 6.6 g/dL (ref 6.0–8.5)

## 2019-06-08 LAB — CBC WITH DIFFERENTIAL/PLATELET
Basophils Absolute: 0.1 10*3/uL (ref 0.0–0.2)
Basos: 1 %
EOS (ABSOLUTE): 0.2 10*3/uL (ref 0.0–0.4)
Eos: 4 %
Hematocrit: 43.6 % (ref 37.5–51.0)
Hemoglobin: 15 g/dL (ref 13.0–17.7)
Immature Grans (Abs): 0 10*3/uL (ref 0.0–0.1)
Immature Granulocytes: 0 %
Lymphocytes Absolute: 2.3 10*3/uL (ref 0.7–3.1)
Lymphs: 39 %
MCH: 30 pg (ref 26.6–33.0)
MCHC: 34.4 g/dL (ref 31.5–35.7)
MCV: 87 fL (ref 79–97)
Monocytes Absolute: 0.6 10*3/uL (ref 0.1–0.9)
Monocytes: 10 %
Neutrophils Absolute: 2.7 10*3/uL (ref 1.4–7.0)
Neutrophils: 46 %
Platelets: 229 10*3/uL (ref 150–450)
RBC: 5 x10E6/uL (ref 4.14–5.80)
RDW: 12.2 % (ref 11.6–15.4)
WBC: 5.9 10*3/uL (ref 3.4–10.8)

## 2019-06-08 LAB — LIPID PANEL
Chol/HDL Ratio: 3.4 ratio (ref 0.0–5.0)
Cholesterol, Total: 171 mg/dL (ref 100–199)
HDL: 51 mg/dL (ref >39)
LDL Chol Calc (NIH): 100 mg/dL — ABNORMAL HIGH (ref 0–99)
Triglycerides: 109 mg/dL (ref 0–149)
VLDL Cholesterol Cal: 20 mg/dL (ref 5–40)

## 2019-06-08 LAB — TESTOSTERONE,FREE AND TOTAL
Testosterone, Free: 17.8 pg/mL (ref 8.7–25.1)
Testosterone: 494 ng/dL (ref 264–916)

## 2019-06-08 LAB — TSH: TSH: 2.88 u[IU]/mL (ref 0.450–4.500)

## 2019-06-08 NOTE — Telephone Encounter (Signed)
Patient was advised.  

## 2019-06-08 NOTE — Telephone Encounter (Signed)
-----   Message from Trinna Post, [place] sent at 06/08/2019  9:48 AM EST ----- Testosterone in normal range. One slightly elevated liver enzyme which is typically due to tylenol or alcohol but I don't find it overly concerning. Remaining labs are normal.

## 2019-06-12 ENCOUNTER — Other Ambulatory Visit: Payer: Self-pay | Admitting: Physician Assistant

## 2019-06-12 DIAGNOSIS — F411 Generalized anxiety disorder: Secondary | ICD-10-CM

## 2019-06-12 NOTE — Telephone Encounter (Signed)
Sent in celexa.

## 2019-06-12 NOTE — Telephone Encounter (Signed)
Requested medication (s) are due for refill today: yes  Requested medication (s) are on the active medication list: yes  Last refill:  05/15/2019  Future visit scheduled: no  Notes to clinic:  Pharmacy states that for some reason on there end the script was closed on and needs to be resent. They have a script ready for patient now but it only for 30 tabs with no refills   Requested Prescriptions  Pending Prescriptions Disp Refills   citalopram (CELEXA) 20 MG tablet [Pharmacy Med Name: CITALOPRAM 20MG  TABLETS] 30 tablet 3    Sig: TAKE 1 TABLET(20 MG) BY MOUTH DAILY     Psychiatry:  Antidepressants - SSRI Failed - 06/12/2019  9:06 AM      Failed - Completed PHQ-2 or PHQ-9 in the last 360 days.      Passed - Valid encounter within last 6 months    Recent Outpatient Visits          6 days ago Other fatigue   [place], [place], [place]   2 months ago GAD (generalized anxiety disorder)   [place], [place]   3 months ago Right knee pain, unspecified chronicity   [place], [place], PA-C   3 months ago GAD (generalized anxiety disorder)   [place], [place]   10 months ago [place], [place], [place]

## 2019-06-20 LAB — PULMONARY FUNCTION TEST

## 2019-06-26 ENCOUNTER — Telehealth: Payer: Self-pay

## 2019-06-26 ENCOUNTER — Telehealth: Payer: Self-pay | Admitting: Physician Assistant

## 2019-06-26 ENCOUNTER — Telehealth: Payer: Self-pay | Admitting: *Deleted

## 2019-06-26 NOTE — Telephone Encounter (Signed)
Copied from [place] [id number]. Topic: Quick Communication - Other Results (Clinic Use ONLY) >> Jun 26, 2019  2:55 PM Kizzie Furnish, CMA wrote: Called patient to inform them of sleep study results. When patient returns call, triage nurse may disclose results. >> Jun 26, 2019  3:17 PM Sheppard Coil, [employer] wrote: Pt returning call.  Per NT nothing in for them to give.

## 2019-06-26 NOTE — Telephone Encounter (Signed)
Agent had pt on line for me to give a sleep study report.  However I could not locate the sleep study interpretation   Only the order.    She is going to transfer it to [facility].

## 2019-06-26 NOTE — Telephone Encounter (Signed)
Sleep study returned and showed moderate sleep apnea. I am signing forms for CPAP orders and he should be contacted about the machine shortly.

## 2019-06-26 NOTE — Telephone Encounter (Signed)
LMTCB 06/26/2019  Thanks,   -Nahum Sherrer  

## 2019-06-27 NOTE — Telephone Encounter (Signed)
Patient was advised via a different message.

## 2019-06-27 NOTE — Telephone Encounter (Signed)
Patient was advised and states that the company for the CPAP called him on yesterday.

## 2019-07-10 NOTE — Progress Notes (Signed)
Patient: Ryan Dudley Male    DOB: 1980-09-06   38 y.o.   MRN: [id number] Visit Date: 07/11/2019  Today's Provider: Trey Sailors, PA-C   Chief Complaint  Patient presents with  . Follow-up  . Sleep Apnea   Subjective:    HPI   Suspected sleep apnea From 06/06/2019-Home sleep test ordered. He got diagnosed with sleep apnea and was contacted by sleep company. He thinks the machine may be coming in the mail.   Patient also wants to discuss his focus issues. Got into [employer] - Global Risk program - two year program, economic and political risk in different areas of the world. Hybrid program online and in person. Starts in August of 2021.   He has been having trouble focusing. This has been a lifelong issue. He has had similar struggles when he was a child but never had a formal ADHD evaluation. He reports almost losing his scholarship due to inability to handle workload.   Allergies  Allergen Reactions  . Ceclor [Cefaclor]     States he was administered this while hospitalized without reaction.     Current Outpatient Medications:  .  citalopram (CELEXA) 20 MG tablet, TAKE 1 TABLET(20 MG) BY MOUTH DAILY, Disp: 30 tablet, Rfl: 3 .  hydrOXYzine (ATARAX/VISTARIL) 50 MG tablet, Take 1 tablet (50 mg total) by mouth 3 (three) times daily as needed., Disp: 30 tablet, Rfl: 1 .  sildenafil (VIAGRA) 100 MG tablet, Take 0.5-1 tablets (50-100 mg total) by mouth daily as needed for erectile dysfunction. (Patient not taking: Reported on 07/11/2019), Disp: 5 tablet, Rfl: 11  Review of Systems  Constitutional: Negative for appetite change, chills and fever.  Respiratory: Negative for chest tightness, shortness of breath and wheezing.   Cardiovascular: Negative for chest pain and palpitations.  Gastrointestinal: Negative for abdominal pain, nausea and vomiting.    Social History   Tobacco Use  . Smoking status: Former Smoker    Packs/day: 0.75    Types: Cigarettes   Quit date: 05/19/2017    Years since quitting: 2.1  . Smokeless tobacco: Former Engineer, water Use Topics  . Alcohol use: No      Objective:   BP 128/84 (BP Location: Left Arm, Patient Position: Sitting, Cuff Size: Large)   Pulse 75   Temp (!) 96.9 F (36.1 C) (Other (Comment))   Resp 16   Ht 5\' 7"  (1.702 m)   Wt 198 lb (89.8 kg)   SpO2 97%   BMI 31.01 kg/m  Vitals:   07/11/19 0937  BP: 128/84  Pulse: 75  Resp: 16  Temp: (!) 96.9 F (36.1 C)  TempSrc: Other (Comment)  SpO2: 97%  Weight: 198 lb (89.8 kg)  Height: 5\' 7"  (1.702 m)  Body mass index is 31.01 kg/m.   Physical Exam Constitutional:      Appearance: Normal appearance.  Cardiovascular:     Rate and Rhythm: Normal rate.  Pulmonary:     Effort: Pulmonary effort is normal.  Skin:    General: Skin is warm and dry.  Neurological:     Mental Status: He is alert and oriented to person, place, and time. Mental status is at baseline.  Psychiatric:        Mood and Affect: Mood normal.        Behavior: Behavior normal.      No results found for any visits on 07/11/19.     Assessment & Plan    1.  Attention deficit  We will trial Adderall. Refer for ADHD testing. If positive, will continue script. Controlled substance agreement signed. Follow up one month.   - Ambulatory referral to Psychology - amphetamine-dextroamphetamine (ADDERALL) 5 MG tablet; Take 1 tablet (5 mg total) by mouth 2 (two) times daily with a meal.  Dispense: 60 tablet; Refill: 0  2. Sleep apnea, unspecified type  Positive and orders for CPAP have been signed.  The entirety of the information documented in the History of Present Illness, Review of Systems and Physical Exam were personally obtained by me. Portions of this information were initially documented by April M. Sabra Heck, CMA and reviewed by me for thoroughness and accuracy.        Trinna Post, PA-C  [facility]

## 2019-07-11 ENCOUNTER — Encounter: Payer: Self-pay | Admitting: Physician Assistant

## 2019-07-11 ENCOUNTER — Ambulatory Visit (INDEPENDENT_AMBULATORY_CARE_PROVIDER_SITE_OTHER): Admitting: Physician Assistant

## 2019-07-11 ENCOUNTER — Other Ambulatory Visit: Payer: Self-pay

## 2019-07-11 ENCOUNTER — Encounter: Payer: Self-pay | Admitting: *Deleted

## 2019-07-11 VITALS — BP 128/84 | HR 75 | Temp 96.9°F | Resp 16 | Ht 67.0 in | Wt 198.0 lb

## 2019-07-11 DIAGNOSIS — R41840 Attention and concentration deficit: Secondary | ICD-10-CM | POA: Diagnosis not present

## 2019-07-11 DIAGNOSIS — G4730 Sleep apnea, unspecified: Secondary | ICD-10-CM

## 2019-07-11 MED ORDER — AMPHETAMINE-DEXTROAMPHETAMINE 5 MG PO TABS: 5.0000 mg | ORAL_TABLET | Freq: Two times a day (BID) | ORAL | 0 refills | Status: DC

## 2019-07-11 NOTE — Patient Instructions (Signed)

## 2019-07-25 IMAGING — CT CT OF THE RIGHT KNEE WITHOUT CONTRAST
3 of 6 series · 11 of 33 positions shown, 13 images · non-contrast
Comparison: None.

CLINICAL DATA: Right knee pain and catching since an injury running
in [DATE]. Initial encounter.

EXAM:
CT OF THE RIGHT KNEE WITHOUT CONTRAST
TECHNIQUE: Multidetector CT imaging of the right knee was performed according
to the standard protocol. Multiplanar CT image reconstructions were
also generated.

[Series 3: axial bone sfov lower extremity · axial · 0.29mm/px · z∈[+971,+1165]mm · 5 of 364 slices shown, 7 images]
[im 61/364  soft-tissue]
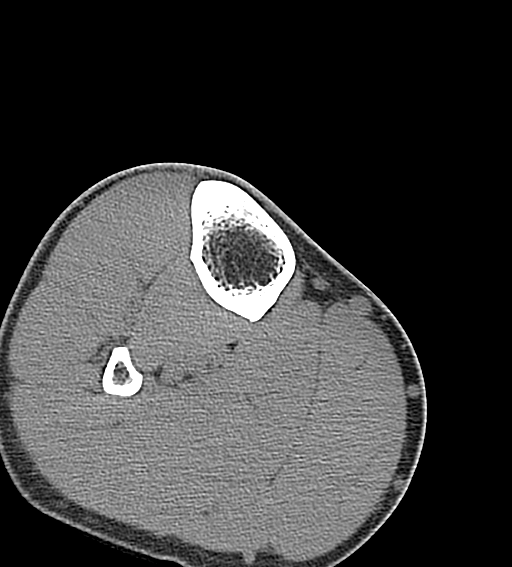
[im 61/364  bone]
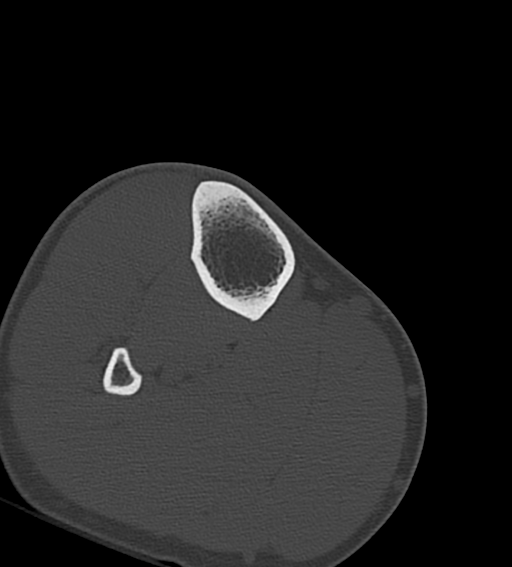
[im 122/364  bone]
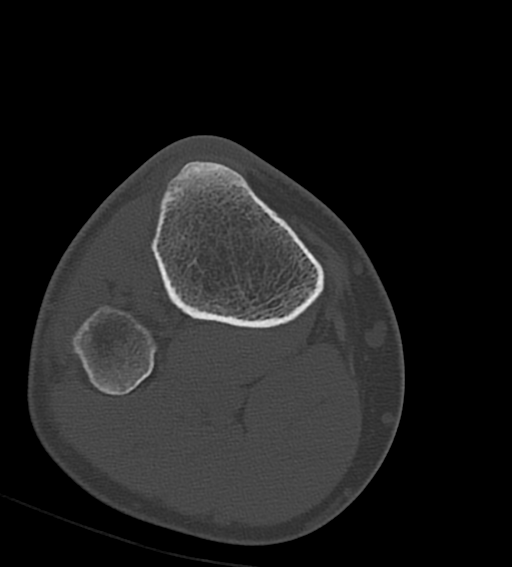
[im 182/364  bone]
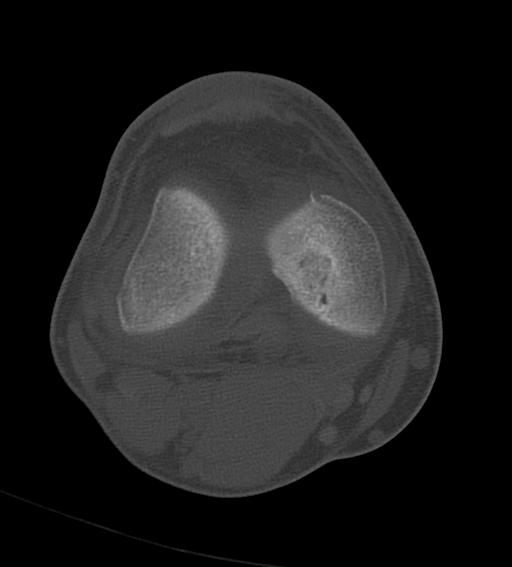
[im 243/364  bone]
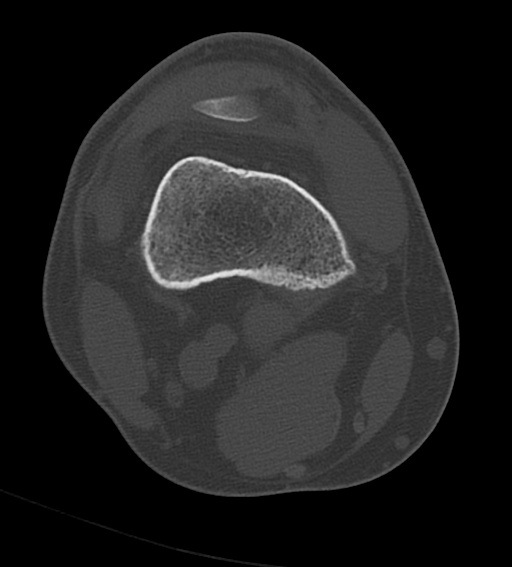
[im 303/364  soft-tissue]
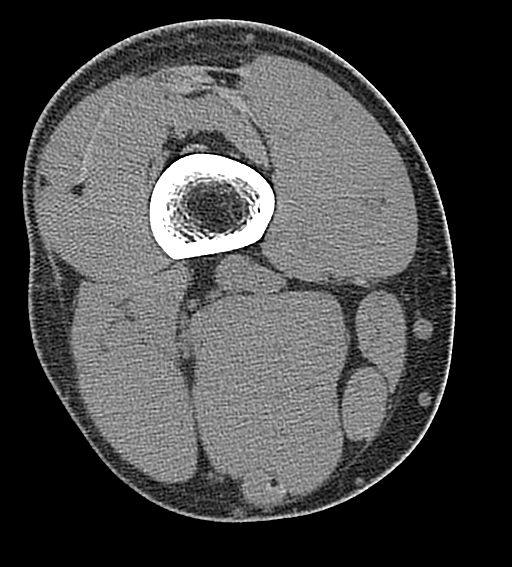
[im 303/364  bone]
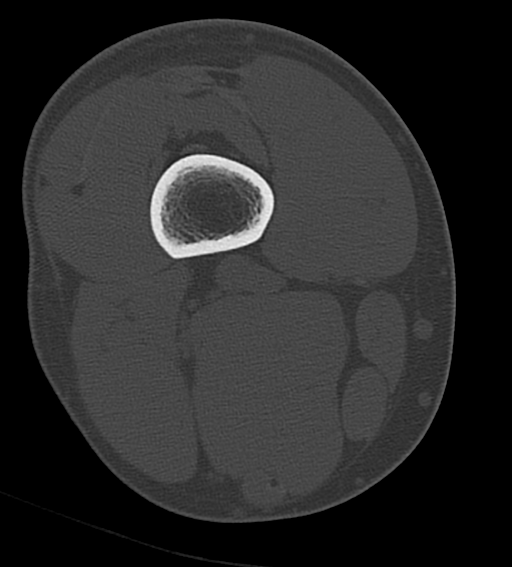

[Series 13: sagittal bone sfov lower extremity · sagittal · 0.32mm/px · 5 of 150 slices shown]
[im 25/150  bone]
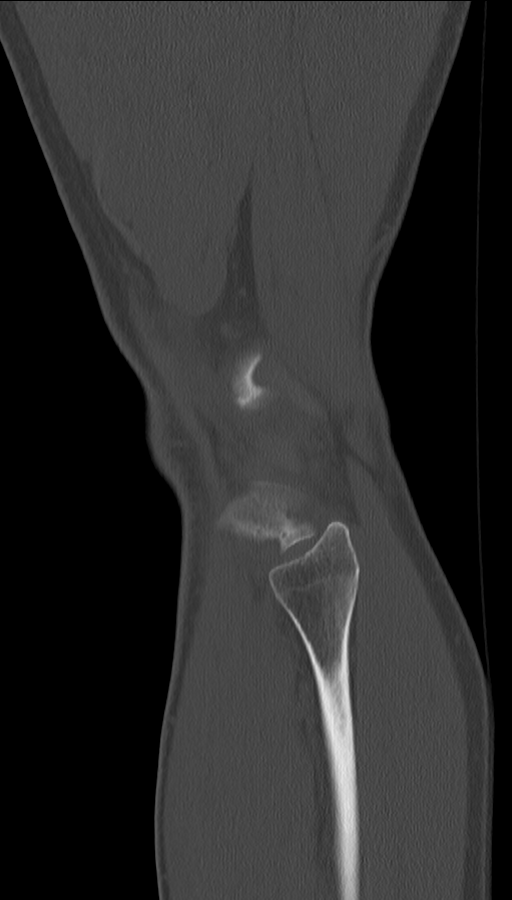
[im 50/150  bone]
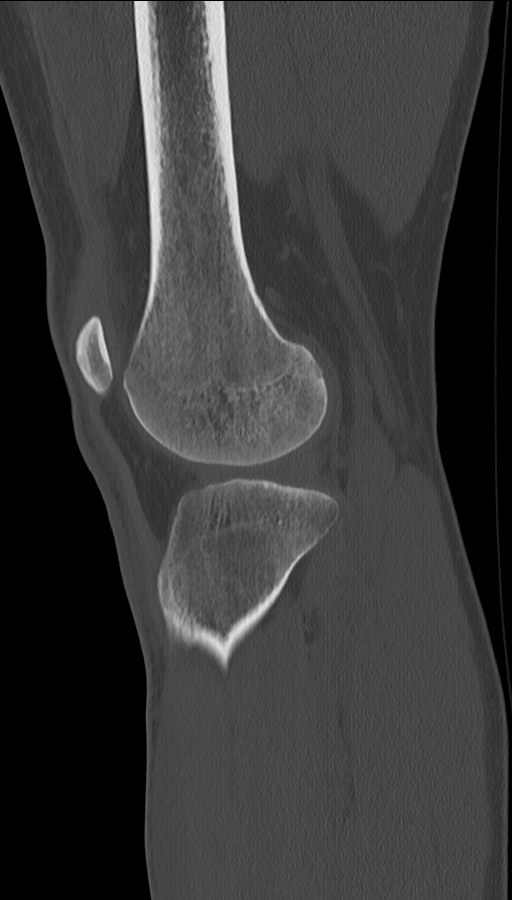
[im 75/150  bone]
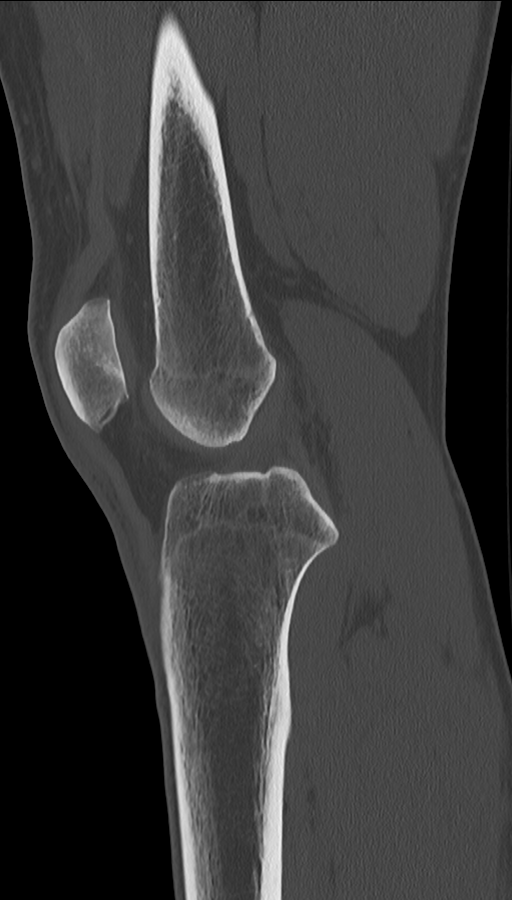
[im 100/150  bone]
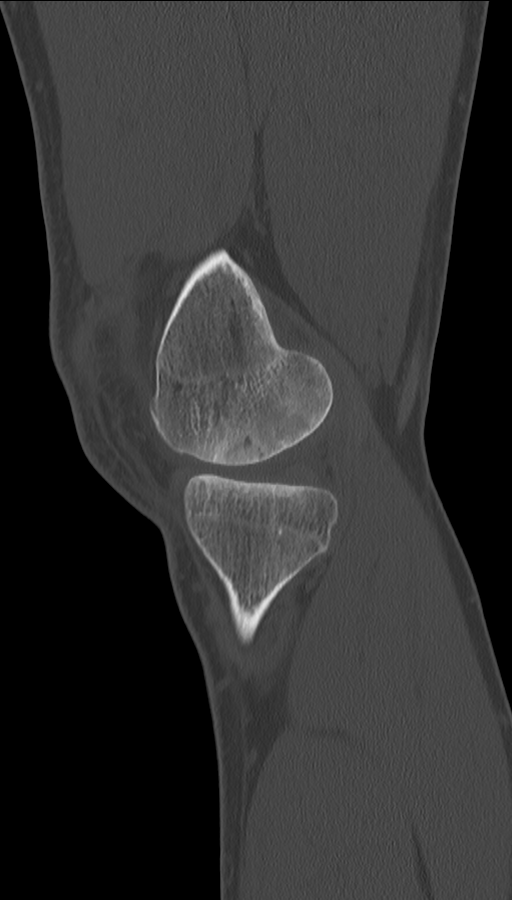
[im 125/150  bone]
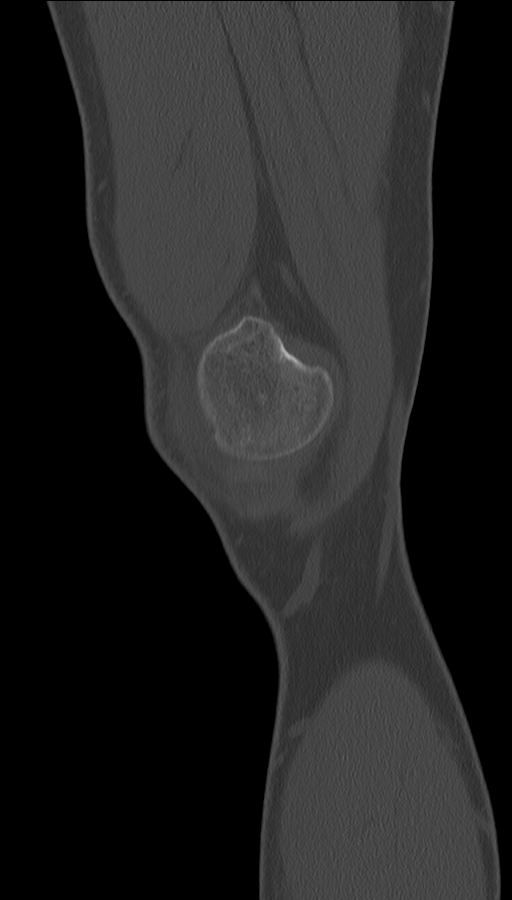

[Series 17: coronal bone sfov lower extremity · coronal · 0.29mm/px · 1 of 206 slices shown]
[im 103/206  bone]
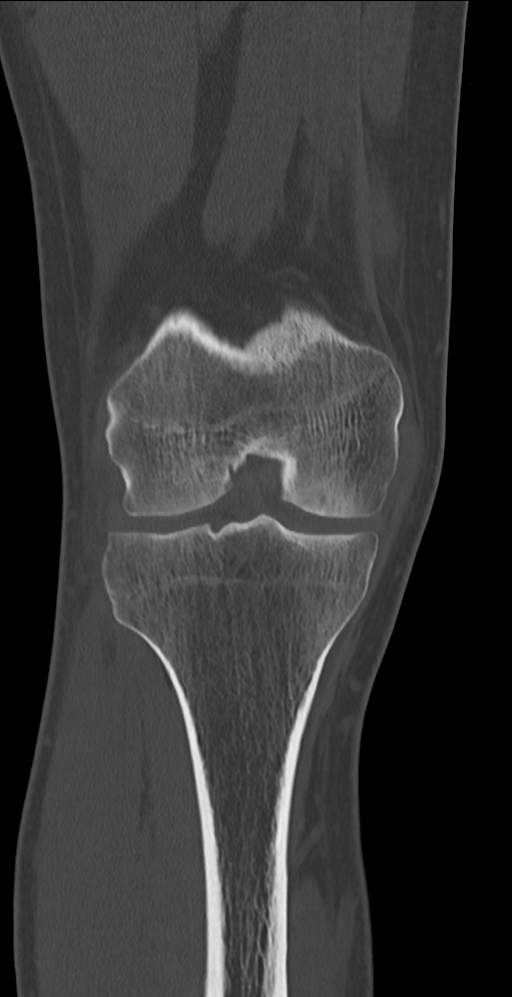

[11 of 33 positions shown; findings below may reference images not displayed]

FINDINGS: Bones/Joint/Cartilage

No fracture is identified. There is some subchondral sclerosis and a
few tiny cysts in the medial femoral condyle worrisome for an
osteochondral injury. No loose fragment is identified.

Ligaments

Suboptimally assessed by CT. The cruciate and collateral ligaments
appear intact. Mild thickening of the superior fibers of the medial
collateral ligament is compatible with sprain.

Muscles and Tendons

Intact and normal in appearance.

Soft tissues

Very small joint effusion is identified.  No Baker's cyst.
IMPRESSION: Mild thickening of the superior fibers of the MCL is compatible with
sprain. Ligaments and menisci appear intact.

Subchondral sclerosis and a few tiny subchondral cysts in the
weight-bearing medial femoral condyle may be due to an osteochondral
injury. MRI could be used for further evaluation.

## 2019-08-06 ENCOUNTER — Other Ambulatory Visit: Payer: Self-pay | Admitting: Physician Assistant

## 2019-08-06 DIAGNOSIS — F411 Generalized anxiety disorder: Secondary | ICD-10-CM

## 2019-08-10 ENCOUNTER — Ambulatory Visit: Admitting: Physician Assistant

## 2019-08-13 ENCOUNTER — Ambulatory Visit (INDEPENDENT_AMBULATORY_CARE_PROVIDER_SITE_OTHER): Admitting: Physician Assistant

## 2019-08-13 ENCOUNTER — Encounter: Payer: Self-pay | Admitting: Physician Assistant

## 2019-08-13 DIAGNOSIS — R41840 Attention and concentration deficit: Secondary | ICD-10-CM

## 2019-08-13 NOTE — Progress Notes (Signed)
Patient: Ryan Dudley Male    DOB: 1981-03-04   39 y.o.   MRN: [id number] Visit Date: 08/13/2019  Today's Provider: Trinna Post, PA-C   Chief Complaint  Patient presents with  . Follow-up    ADD/ADHD   Subjective:    Virtual Visit via Telephone Note  I connected with Ryan Dudley on 08/13/19 at  1:20 PM EST by telephone and verified that I am speaking with the correct person using two identifiers.  Location: Patient: Home Provider: Office   I discussed the limitations, risks, security and privacy concerns of performing an evaluation and management service by telephone and the availability of in person appointments. I also discussed with the patient that there may be a patient responsible charge related to this service. The patient expressed understanding and agreed to proceed.  I discussed the assessment and treatment plan with the patient. The patient was provided an opportunity to ask questions and all were answered. The patient agreed with the plan and demonstrated an understanding of the instructions.   The patient was advised to call back or seek an in-person evaluation if the symptoms worsen or if the condition fails to improve as anticipated.     HPI   Follow up for ADD/ADHD  The patient was last seen for this 1 months ago. Changes made at last visit include started patient on Adderall 5 mg and referral was placed for psychiatry. Tried several instances to take 10 mg at a time which worked better. Psychology appointment for ADHD evaluation is scheduled for 10/2019.  He reports excellent compliance with treatment. He feels that condition is Improved. He is not having side effects.   Anxiety and Depression: Patient reports he is not taking celexa regularly because he was fearful of interactions between citalopram and adderall.  ------------------------------------------------------------------------------------   Allergies  Allergen Reactions  .  Ceclor [Cefaclor]     States he was administered this while hospitalized without reaction.     Current Outpatient Medications:  .  amphetamine-dextroamphetamine (ADDERALL) 5 MG tablet, Take 1 tablet (5 mg total) by mouth 2 (two) times daily with a meal., Disp: 60 tablet, Rfl: 0 .  citalopram (CELEXA) 20 MG tablet, TAKE 1 TABLET(20 MG) BY MOUTH DAILY, Disp: 30 tablet, Rfl: 3 .  hydrOXYzine (ATARAX/VISTARIL) 50 MG tablet, TAKE 1 TABLET(50 MG) BY MOUTH THREE TIMES DAILY AS NEEDED, Disp: 30 tablet, Rfl: 1 .  sildenafil (VIAGRA) 100 MG tablet, Take 0.5-1 tablets (50-100 mg total) by mouth daily as needed for erectile dysfunction. (Patient not taking: Reported on 07/11/2019), Disp: 5 tablet, Rfl: 11  Review of Systems  Social History   Tobacco Use  . Smoking status: Former Smoker    Packs/day: 0.75    Types: Cigarettes    Quit date: 05/19/2017    Years since quitting: 2.2  . Smokeless tobacco: Former Network engineer Use Topics  . Alcohol use: No      Objective:   There were no vitals taken for this visit. There were no vitals filed for this visit.There is no height or weight on file to calculate BMI.   Physical Exam Pulmonary:     Effort: No respiratory distress.      No results found for any visits on 08/13/19.     Assessment & Plan    1. Attention deficit  Increase to 10 mg BID. Follow up in 3 months after formal psychologic evaluation. OK to take with citalopram.   - amphetamine-dextroamphetamine (  ADDERALL) 10 MG tablet; Take 1 tablet (10 mg total) by mouth 2 (two) times daily.  Dispense: 60 tablet; Refill: 0  The entirety of the information documented in the History of Present Illness, Review of Systems and Physical Exam were personally obtained by me. Portions of this information were initially documented by [facility] and reviewed by me for thoroughness and accuracy.      I provided 25 minutes of non-face-to-face time during this encounter. Trey Sailors,  PA-C  [facility]

## 2019-08-14 ENCOUNTER — Other Ambulatory Visit: Payer: Self-pay | Admitting: Physician Assistant

## 2019-08-14 DIAGNOSIS — R41840 Attention and concentration deficit: Secondary | ICD-10-CM

## 2019-08-14 NOTE — Telephone Encounter (Signed)
Requested medication (s) are due for refill today: yes  Requested medication (s) are on the active medication list: yes  Last refill: 07/11/2019  Future visit scheduled: no  Notes to clinic:  not delegated    Requested Prescriptions  Pending Prescriptions Disp Refills   amphetamine-dextroamphetamine (ADDERALL) 5 MG tablet 60 tablet 0    Sig: Take 1 tablet (5 mg total) by mouth 2 (two) times daily with a meal.      Not Delegated - Psychiatry:  Stimulants/ADHD Failed - 08/14/2019 10:05 AM      Failed - This refill cannot be delegated      Failed - Urine Drug Screen completed in last 360 days.      Passed - Valid encounter within last 3 months    Recent Outpatient Visits           Yesterday Attention deficit   [facility] [place], Lavella Hammock, [place]   1 month ago Attention deficit   [facility] Osvaldo Angst M, [place]   2 months ago Other fatigue   [facility] Os[place]do Angst [place], [place]   4 months ago GAD (generalized anxiety disorder)   [facility] [place], Ricki Rodriguez [place], [place]   5 months ago Right knee pain, unspecified chronicity   [facility] [place], Ricki Rodriguez [place], [place]

## 2019-08-14 NOTE — Telephone Encounter (Signed)
Medication Refill - Medication:  amphetamine-dextroamphetamine (ADDERALL) 5 MG tablet [[id number]]  Has the patient contacted their pharmacy? Yes advised to call office.   Preferred Pharmacy (with phone number or street name):  [id number] [place], [place] - [address] AT [address] [facility] [address] Phone:  [phone]  Fax:  [phone]     Agent: Please be advised that RX refills may take up to 3 business days. We ask that you follow-up with your pharmacy.

## 2019-08-15 MED ORDER — AMPHETAMINE-DEXTROAMPHETAMINE 10 MG PO TABS: 10.0000 mg | ORAL_TABLET | Freq: Two times a day (BID) | ORAL | 0 refills | Status: DC

## 2019-09-18 ENCOUNTER — Other Ambulatory Visit: Payer: Self-pay | Admitting: Physician Assistant

## 2019-09-18 DIAGNOSIS — R41840 Attention and concentration deficit: Secondary | ICD-10-CM

## 2019-09-18 MED ORDER — AMPHETAMINE-DEXTROAMPHETAMINE 10 MG PO TABS: 10.0000 mg | ORAL_TABLET | Freq: Two times a day (BID) | ORAL | 0 refills | Status: DC

## 2019-09-18 NOTE — Telephone Encounter (Signed)
Medication Refill - Medication: Generic Adderall 10mg    Has the patient contacted their pharmacy? No. (Agent: If no, request that the patient contact the pharmacy for the refill.) (Agent: If yes, when and what did the pharmacy advise?)  Preferred Pharmacy (with phone number or street name): [facility] [address] : Please be advised that RX refills may take up to 3 business days. We ask that you follow-up with your pharmacy.

## 2019-09-18 NOTE — Telephone Encounter (Signed)
Requested medication (s) are due for refill today: Yes  Requested medication (s) are on the active medication list: Yes  Last refill:  08/15/19  Future visit scheduled: No  Notes to clinic:  See request.    Requested Prescriptions  Pending Prescriptions Disp Refills   amphetamine-dextroamphetamine (ADDERALL) 10 MG tablet 60 tablet 0    Sig: Take 1 tablet (10 mg total) by mouth 2 (two) times daily.      Not Delegated - Psychiatry:  Stimulants/ADHD Failed - 09/18/2019 10:25 AM      Failed - This refill cannot be delegated      Failed - Urine Drug Screen completed in last 360 days.      Passed - Valid encounter within last 3 months    Recent Outpatient Visits           1 month ago Attention deficit   [facility] Osvaldo Angst M, [place]   2 months ago Attention deficit   [facility] Osvaldo Angst M, [place]   3 months ago Other fatigue   [facility] [place], Ricki Rodriguez M, [place]   5 months ago GAD (generalized anxiety disorder)   [facility] [place], Ricki Rodriguez M, [place]   6 months ago Right knee pain, unspecified chronicity   [facility] Osvaldo Angst [place], [place]

## 2019-10-16 ENCOUNTER — Other Ambulatory Visit: Payer: Self-pay | Admitting: Physician Assistant

## 2019-10-16 DIAGNOSIS — R41840 Attention and concentration deficit: Secondary | ICD-10-CM

## 2019-10-16 MED ORDER — AMPHETAMINE-DEXTROAMPHETAMINE 10 MG PO TABS: 10.0000 mg | ORAL_TABLET | Freq: Two times a day (BID) | ORAL | 0 refills | Status: DC

## 2019-10-16 NOTE — Telephone Encounter (Signed)
Medication Refill - Medication: adderall  Has the patient contacted their pharmacy? Yes.   (Agent: If no, request that the patient contact the pharmacy for the refill.) (Agent: If yes, when and what did the pharmacy advise?)  Preferred Pharmacy (with phone number or street name):  [facility] [phone] - [place], [place] - [address] AT [facility] [address]  [address] [place] [address]  Phone: [phone] Fax: [phone]  Not a 24 hour pharmacy; exact hours not known.     Agent: Please be advised that RX refills may take up to 3 business days. We ask that you follow-up with your pharmacy.

## 2019-10-16 NOTE — Telephone Encounter (Signed)
Requested medication (s) are due for refill today: yes  Requested medication (s) are on the active medication list: yes  Last refill: 09/18/19  Future visit scheduled: no   Notes to clinic:  not delegated    Requested Prescriptions  Pending Prescriptions Disp Refills   amphetamine-dextroamphetamine (ADDERALL) 10 MG tablet 60 tablet 0    Sig: Take 1 tablet (10 mg total) by mouth 2 (two) times daily.      Not Delegated - Psychiatry:  Stimulants/ADHD Failed - 10/16/2019  3:01 PM      Failed - This refill cannot be delegated      Failed - Urine Drug Screen completed in last 360 days.      Passed - Valid encounter within last 3 months    Recent Outpatient Visits           2 months ago Attention deficit   [facility] Ryan Dudley, [place]   3 months ago Attention deficit   [facility] Ryan Dudley, [place]   4 months ago Other fatigue   [facility] Ryan Dudley, [place]   6 months ago GAD (generalized anxiety disorder)   [facility] [place], Ryan Dudley, [place]   7 months ago Right knee pain, unspecified chronicity   [facility] [place], Ryan Dudley, [place]

## 2019-11-06 ENCOUNTER — Ambulatory Visit (INDEPENDENT_AMBULATORY_CARE_PROVIDER_SITE_OTHER): Admitting: Psychology

## 2019-11-06 DIAGNOSIS — F419 Anxiety disorder, unspecified: Secondary | ICD-10-CM

## 2019-11-19 ENCOUNTER — Other Ambulatory Visit: Payer: Self-pay | Admitting: Physician Assistant

## 2019-11-19 ENCOUNTER — Telehealth: Payer: Self-pay

## 2019-11-19 DIAGNOSIS — R41840 Attention and concentration deficit: Secondary | ICD-10-CM

## 2019-11-19 MED ORDER — AMPHETAMINE-DEXTROAMPHETAMINE 10 MG PO TABS: 10.0000 mg | ORAL_TABLET | Freq: Two times a day (BID) | ORAL | 0 refills | Status: DC

## 2019-11-19 NOTE — Telephone Encounter (Signed)
Copied from CRM [id number]. Topic: General - Other >> Nov 19, 2019  3:15 PM [name] A wrote: Patient would like a callback from nurse in regards to if Ryan Dudley wants to see patient today so that he can get a refill on medication Please advise

## 2019-11-19 NOTE — Telephone Encounter (Signed)
I have filled it. I would like him to schedule follow up 1 month after he has seen the psychologist for evaluation.

## 2019-11-19 NOTE — Telephone Encounter (Signed)
Requested medication (s) are due for refill today: Yes  Requested medication (s) are on the active medication list: Yes  Last refill:  10/16/19  Future visit scheduled: No  Notes to clinic:  Pt. Has appointment 11/21/19 with Behavorial Med. Wants to know how soon his PCP wants to see him.    Requested Prescriptions  Pending Prescriptions Disp Refills   amphetamine-dextroamphetamine (ADDERALL) 10 MG tablet 60 tablet 0    Sig: Take 1 tablet (10 mg total) by mouth 2 (two) times daily.      Not Delegated - Psychiatry:  Stimulants/ADHD Failed - 11/19/2019  8:39 AM      Failed - This refill cannot be delegated      Failed - Urine Drug Screen completed in last 360 days.      Failed - Valid encounter within last 3 months    Recent Outpatient Visits           3 months ago Attention deficit   [facility] Os[place]do Angst [place], [place]   4 months ago Attention deficit   [facility] Osvaldo Angst [place], [place]   5 months ago Other fatigue   [facility] Os[place]do Angst [place], [place]   7 months ago GAD (generalized anxiety disorder)   [facility] [place], Ricki Rodriguez M, [place]   8 months ago Right knee pain, unspecified chronicity   [facility] [place], Ricki Rodriguez [place], [place]

## 2019-11-19 NOTE — Telephone Encounter (Signed)
Copied from CRM [id number]. Topic: Quick Communication - Rx Refill/Question >> Nov 19, 2019  8:31 AM Jaquita Rector A wrote: Medication: amphetamine-dextroamphetamine (ADDERALL) 10 MG tablet  Has the patient contacted their pharmacy? Yes.   (Agent: If no, request that the patient contact the pharmacy for the refill.) (Agent: If yes, when and what did the pharmacy advise?)  Preferred Pharmacy (with phone number or street name): [id number] [place], [place] - [address] AT [facility] [address]  Phone:  [phone] Fax:  [phone]     Agent: Please be advised that RX refills may take up to 3 business days. We ask that you follow-up with your pharmacy.

## 2019-11-20 NOTE — Telephone Encounter (Signed)
Patient was advised and scheduled appointment for 12/28/2019 @ 11:20 AM.

## 2019-11-21 ENCOUNTER — Ambulatory Visit: Admitting: Psychology

## 2019-11-22 DIAGNOSIS — F900 Attention-deficit hyperactivity disorder, predominantly inattentive type: Secondary | ICD-10-CM

## 2019-11-22 DIAGNOSIS — F4310 Post-traumatic stress disorder, unspecified: Secondary | ICD-10-CM | POA: Diagnosis not present

## 2019-12-18 ENCOUNTER — Other Ambulatory Visit: Payer: Self-pay | Admitting: Physician Assistant

## 2019-12-18 DIAGNOSIS — R41840 Attention and concentration deficit: Secondary | ICD-10-CM

## 2019-12-18 MED ORDER — AMPHETAMINE-DEXTROAMPHETAMINE 10 MG PO TABS: 10.0000 mg | ORAL_TABLET | Freq: Two times a day (BID) | ORAL | 0 refills | Status: DC

## 2019-12-18 NOTE — Telephone Encounter (Signed)
Requested medication (s) are due for refill today: yes  Requested medication (s) are on the active medication list: yes  Last refill:  11/19/19  Future visit scheduled: yes  Notes to clinic:  Not delegated    Requested Prescriptions  Pending Prescriptions Disp Refills   amphetamine-dextroamphetamine (ADDERALL) 10 MG tablet 60 tablet 0    Sig: Take 1 tablet (10 mg total) by mouth 2 (two) times daily.      Not Delegated - Psychiatry:  Stimulants/ADHD Failed - 12/18/2019 10:33 AM      Failed - This refill cannot be delegated      Failed - Urine Drug Screen completed in last 360 days.      Failed - Valid encounter within last 3 months    Recent Outpatient Visits           4 months ago Attention deficit   [facility] Os[place]do Angst [place], [place]   5 months ago Attention deficit   [facility] Os[place]do Angst [place], [place]   6 months ago Other fatigue   [facility] Os[place]do Angst [place], [place]   8 months ago GAD (generalized anxiety disorder)   [facility] Os[place]do Angst [place], [place]   9 months ago Right knee pain, unspecified chronicity   [facility] [place], Lavella Hammock, [place]       Future Appointments             In 1 week Trey Sailors, PA-C [employer]

## 2019-12-18 NOTE — Telephone Encounter (Signed)
Medication Refill - Medication: Adderall 10 mg  Has the patient contacted their pharmacy? No. (Agent: If no, request that the patient contact the pharmacy for the refill.) (Agent: If yes, when and what did the pharmacy advise?)  Preferred Pharmacy (with phone number or street name): [facility]  [place] [address] [employer]  Agent: Please be advised that RX refills may take up to 3 business days. We ask that you follow-up with your pharmacy.

## 2019-12-27 ENCOUNTER — Encounter: Payer: Self-pay | Admitting: Physician Assistant

## 2019-12-27 ENCOUNTER — Other Ambulatory Visit: Payer: Self-pay

## 2019-12-27 ENCOUNTER — Ambulatory Visit (INDEPENDENT_AMBULATORY_CARE_PROVIDER_SITE_OTHER): Admitting: Physician Assistant

## 2019-12-27 VITALS — BP 120/80 | HR 69 | Temp 96.9°F | Wt 188.0 lb

## 2019-12-27 DIAGNOSIS — M25532 Pain in left wrist: Secondary | ICD-10-CM | POA: Diagnosis not present

## 2019-12-27 DIAGNOSIS — M25531 Pain in right wrist: Secondary | ICD-10-CM

## 2019-12-27 DIAGNOSIS — F419 Anxiety disorder, unspecified: Secondary | ICD-10-CM

## 2019-12-27 DIAGNOSIS — F909 Attention-deficit hyperactivity disorder, unspecified type: Secondary | ICD-10-CM

## 2019-12-27 MED ORDER — AMPHETAMINE-DEXTROAMPHETAMINE 15 MG PO TABS: 15.0000 mg | ORAL_TABLET | Freq: Two times a day (BID) | ORAL | 0 refills | Status: DC

## 2019-12-27 NOTE — Patient Instructions (Signed)
Wear a cock up wrist splint at night time.  Attention Deficit Hyperactivity Disorder, Adult Attention deficit hyperactivity disorder (ADHD) is a mental health disorder that starts during childhood (neurodevelopmental disorder). For many people with ADHD, the disorder continues into the adult years. Treatment can help you manage your symptoms. What are the causes? The exact cause of ADHD is not known. Most experts believe genetics and environmental factors contribute to ADHD. What increases the risk? The following factors may make you more likely to develop this condition:  Having a family history of ADHD.  Being male.  Being born to a mother who smoked or drank alcohol during pregnancy.  Being exposed to lead or other toxins in the womb or early in life.  Being born before 37 weeks of pregnancy (prematurely) or at a low birth weight.  Having experienced a brain injury. What are the signs or symptoms? Symptoms of this condition depend on the type of ADHD. The two main types are inattentive and hyperactive-impulsive. Some people may have symptoms of both types. Symptoms of the inattentive type include:  Difficulty paying attention.  Making careless mistakes.  Not following instructions.  Being disorganized.  Avoiding tasks that require time and attention.  Losing and forgetting things.  Being easily distracted. Symptoms of the hyperactive-impulsive type include:  Restlessness.  Talking too much.  Interrupting.  Difficulty with: ? Sitting still. ? Feeling motivated. ? Relaxing. ? Waiting in line or waiting for a turn. In adults, this condition may lead to certain problems, such as:  Keeping jobs.  Performing tasks at work.  Having stable relationships.  Being on time or keeping to a schedule. How is this diagnosed? This condition is diagnosed based on your current symptoms and your history of symptoms. The diagnosis can be made by a health care provider such  as a primary care provider or a mental health care specialist. Your health care provider may use a symptom checklist or a behavior rating scale to evaluate your symptoms. He or she may also want to talk with people who have observed your behaviors throughout your life. How is this treated? This condition can be treated with medicines and behavior therapy. Medicines may be the best option to reduce impulsive behaviors and improve attention. Your health care provider may recommend:  Stimulant medicines. These are the most common medicines used for adult ADHD. They affect certain chemicals in the brain (neurotransmitters) and improve your ability to control your symptoms.  A non-stimulant medicine for adult ADHD (atomoxetine). This medicine increases a neurotransmitter called norepinephrine. It may take weeks to months to see effects from this medicine. Counseling and behavioral management are also important for treating ADHD. Counseling is often used along with medicine. Your health care provider may suggest:  Cognitive behavioral therapy (CBT). This type of therapy teaches you to replace negative thoughts and actions with positive thoughts and actions. When used as part of ADHD treatment, this therapy may also include: ? Coping strategies for organization, time management, impulse control, and stress reduction. ? Mindfulness and meditation training.  Behavioral management. You may work with a Psychologist, occupational who is specially trained to help people with ADHD manage and organize activities and function more effectively. Follow these instructions at home: Medicines   Take over-the-counter and prescription medicines only as told by your health care provider.  Talk with your health care provider about the possible side effects of your medicines and how to manage them. Lifestyle   Do not use drugs.  Do not  drink alcohol if: ? Your health care provider tells you not to drink. ? You are pregnant, may be  pregnant, or are planning to become pregnant.  If you drink alcohol: ? Limit how much you use to:  0-1 drink a day for women.  0-2 drinks a day for men. ? Be aware of how much alcohol is in your drink. In the [place] one drink equals one 12 oz bottle of beer (355 mL), one 5 oz glass of wine (148 mL), or one 1 oz glass of hard liquor (44 mL).  Get enough sleep.  Eat a healthy diet.  Exercise regularly. Exercise can help to reduce stress and anxiety. General instructions  Learn as much as you can about adult ADHD, and work closely with your health care providers to find the treatments that work best for you.  Follow the same schedule each day.  Use reminder devices like notes, calendars, and phone apps to stay on time and organized.  Keep all follow-up visits as told by your health care provider and therapist. This is important. Where to find more information A health care provider may be able to recommend resources that are available online or over the phone. You could start with:  Attention Deficit Disorder Association (ADDA): PubAddiction.co.nz  National Institute of Mental Health [facility]): https://carter.com/ Contact a health care provider if:  Your symptoms continue to cause problems.  You have side effects from your medicine, such as: ? Repeated muscle twitches, coughing, or speech outbursts. ? Sleep problems. ? Loss of appetite. ? Dizziness. ? Unusually fast heartbeat. ? Stomach pains. ? Headaches.  You are struggling with anxiety, depression, or substance abuse. Get help right away if you:  Have a severe reaction to a medicine. If you ever feel like you may hurt yourself or others, or have thoughts about taking your own life, get help right away. You can go to the nearest emergency department or call:  Your local emergency services (911 in the [place]  A suicide crisis helpline, such as the [facility] at (573)783-4293. This is open 24 hours a  day. Summary  ADHD is a mental health disorder that starts during childhood (neurodevelopmental disorder) and often continues into the adult years.  The exact cause of ADHD is not known. Most experts believe genetics and environmental factors contribute to ADHD.  There is no cure for ADHD, but treatment with medicine, cognitive behavioral therapy, or behavioral management can help you manage your condition. This information is not intended to replace advice given to you by your health care provider. Make sure you discuss any questions you have with your health care provider. Document Revised: 11/27/2018 Document Reviewed: 11/27/2018 Elsevier Patient Education  [employer].

## 2019-12-27 NOTE — Progress Notes (Signed)
Established patient visit   Patient: Ryan Dudley   DOB: 1981-02-19   39 y.o. Male  MRN: [id number] Visit Date: 12/27/2019  Today's healthcare provider: Trinna Post, PA-C   Chief Complaint  Patient presents with  . ADHD   Subjective    HPI Follow up for ADHD  The patient was last seen for this 5 months ago. Changes made at last visit include no changes. Patient reports having 2 visit with psychologist Dr. Lurline Hare and was positive for ADHD.  He reports good compliance with treatment. He feels that condition is Improved. He is not having side effects.   -----------------------------------------------------------------------------------------  Anxiety, Follow-up  He was last seen for anxiety 7 months ago. Changes made at last visit include no changes.   He reports good compliance with treatment. He reports good tolerance of treatment. He is not having side effects.   He feels his anxiety is mild and Improved since last visit.  Symptoms: No chest pain No difficulty concentrating  No dizziness No fatigue  No feelings of losing control No insomnia  No irritable No palpitations  No panic attacks No racing thoughts  No shortness of breath No sweating  No tremors/shakes    GAD-7 Results GAD-7 Generalized Anxiety Disorder Screening Tool 06/06/2019 02/16/2019  1. Feeling Nervous, Anxious, or on Edge 3 3  2. Not Being Able to Stop or Control Worrying 3 3  3. Worrying Too Much About Different Things 3 2  4. Trouble Relaxing 3 1  5. Being So Restless it's Hard To Sit Still 0 0  6. Becoming Easily Annoyed or Irritable 3 3  7. Feeling Afraid As If Something Awful Might Happen 1 2  Total GAD-7 Score 16 14  Difficulty At Work, Home, or Getting  Along With Others? Very difficult Somewhat difficult    PHQ-9 Scores PHQ9 SCORE ONLY 02/16/2019 11/09/2018 05/12/2017  PHQ-9 Total Score 6 0 0     --------------------------------------------------------------------------------------------------- Hand tingling at times when typing or driving.     Medications: Outpatient Medications Prior to Visit  Medication Sig  . citalopram (CELEXA) 20 MG tablet TAKE 1 TABLET(20 MG) BY MOUTH DAILY  . hydrOXYzine (ATARAX/VISTARIL) 50 MG tablet TAKE 1 TABLET(50 MG) BY MOUTH THREE TIMES DAILY AS NEEDED  . [DISCONTINUED] amphetamine-dextroamphetamine (ADDERALL) 10 MG tablet Take 1 tablet (10 mg total) by mouth 2 (two) times daily.   No facility-administered medications prior to visit.    Review of Systems    Objective    BP 120/80 (BP Location: Left Arm, Patient Position: Sitting, Cuff Size: Normal)   Pulse 69   Temp (!) 96.9 F (36.1 C) (Temporal)   Wt 188 lb (85.3 kg)   SpO2 98%   BMI 29.44 kg/m    Physical Exam Constitutional:      Appearance: Normal appearance.  Cardiovascular:     Rate and Rhythm: Normal rate and regular rhythm.     Pulses: Normal pulses.     Heart sounds: Normal heart sounds.  Pulmonary:     Effort: Pulmonary effort is normal.     Breath sounds: Normal breath sounds.  Abdominal:     General: Abdomen is flat. Bowel sounds are normal.     Palpations: Abdomen is soft.  Musculoskeletal:     Comments: Phalen's positive bilaterally.   Skin:    General: Skin is warm and dry.  Neurological:     General: No focal deficit present.     Mental Status: He is alert and  oriented to person, place, and time.  Psychiatric:        Mood and Affect: Mood normal.        Behavior: Behavior normal.       No results found for any visits on 12/27/19.  Assessment & Plan    1. Attention deficit hyperactivity disorder (ADHD), unspecified ADHD type  Increase as below.   - amphetamine-dextroamphetamine (ADDERALL) 15 MG tablet; Take 1 tablet by mouth 2 (two) times daily.  Dispense: 60 tablet; Refill: 0 - amphetamine-dextroamphetamine (ADDERALL) 15 MG tablet; Take 1  tablet by mouth 2 (two) times daily.  Dispense: 60 tablet; Refill: 0 - amphetamine-dextroamphetamine (ADDERALL) 15 MG tablet; Take 1 tablet by mouth 2 (two) times daily.  Dispense: 60 tablet; Refill: 0  2. Anxiety  Continue Celexa.   3. Pain in both wrists patient was advised he may have carpel tunnel. Wear cock up wrist splint at night. Refer if needed.   ITrey Sailors, PA-C, have reviewed all documentation for this visit. The documentation on 12/27/19 for the exam, diagnosis, procedures, and orders are all accurate and complete.    Maryella Shivers  [facility] [phone] (phone) [phone] (fax)  [facility]

## 2019-12-28 ENCOUNTER — Ambulatory Visit: Payer: Self-pay | Admitting: Physician Assistant

## 2020-01-26 ENCOUNTER — Ambulatory Visit: Attending: Internal Medicine

## 2020-01-26 DIAGNOSIS — Z23 Encounter for immunization: Secondary | ICD-10-CM

## 2020-01-26 NOTE — Progress Notes (Signed)
   Covid-19 Vaccination Clinic  Name:  [name]    MRN: [id number] DOB: [birth year]  01/26/2020  Mr. Clipper was observed post Covid-19 immunization for 15 minutes without incident. He was provided with Vaccine Information Sheet and instruction to access the V-Safe system.   Mr. Gardella was instructed to call 911 with any severe reactions post vaccine: Marland Kitchen Difficulty breathing  . Swelling of face and throat  . A fast heartbeat  . A bad rash all over body  . Dizziness and weakness   Immunizations Administered    Name Date Dose VIS Date Route   JANSSEN COVID-19 VACCINE 01/26/2020 12:10 PM 0.5 mL 09/15/2019 Intramuscular   Manufacturer: [employer]   Lot: [id number]   NDC: 33825-053-97

## 2020-03-18 ENCOUNTER — Encounter: Payer: Self-pay | Admitting: Emergency Medicine

## 2020-03-18 ENCOUNTER — Telehealth: Payer: Self-pay | Admitting: Physician Assistant

## 2020-03-18 ENCOUNTER — Other Ambulatory Visit: Payer: Self-pay

## 2020-03-18 ENCOUNTER — Encounter: Payer: Self-pay | Admitting: Physician Assistant

## 2020-03-18 ENCOUNTER — Emergency Department
Admission: EM | Admit: 2020-03-18 | Discharge: 2020-03-18 | Disposition: A | Discharge status: Home / Self Care | Attending: Emergency Medicine | Admitting: Emergency Medicine

## 2020-03-18 DIAGNOSIS — B349 Viral infection, unspecified: Secondary | ICD-10-CM | POA: Insufficient documentation

## 2020-03-18 DIAGNOSIS — U071 COVID-19: Secondary | ICD-10-CM | POA: Diagnosis not present

## 2020-03-18 DIAGNOSIS — Z20822 Contact with and (suspected) exposure to covid-19: Secondary | ICD-10-CM

## 2020-03-18 DIAGNOSIS — Z87891 Personal history of nicotine dependence: Secondary | ICD-10-CM | POA: Insufficient documentation

## 2020-03-18 DIAGNOSIS — R509 Fever, unspecified: Secondary | ICD-10-CM | POA: Diagnosis present

## 2020-03-18 LAB — RESPIRATORY PANEL BY RT PCR (FLU A&B, COVID)
Influenza A by PCR: NEGATIVE
Influenza B by PCR: NEGATIVE
SARS Coronavirus 2 by RT PCR: POSITIVE — AB

## 2020-03-18 MED ORDER — GUAIFENESIN-CODEINE 100-10 MG/5ML PO SOLN: 10.0000 mL | Freq: Three times a day (TID) | ORAL | 0 refills | Status: DC | PRN

## 2020-03-18 MED ORDER — PREDNISONE 10 MG (21) PO TBPK: ORAL_TABLET | ORAL | 0 refills | Status: DC

## 2020-03-18 NOTE — ED Notes (Signed)
See triage note  Presents with fever cough and body aches  States sx's started couple of days ago  Afebrile on arrival

## 2020-03-18 NOTE — ED Triage Notes (Signed)
Pt to ED via POV. Pt c/o HA/chills, congested, fatigue, dry cough, nausea along with intermittent fever, st taking TheraFlu at home with some relief. Would like to be tested for COVID today.  NAD noted at this time.

## 2020-03-18 NOTE — ED Provider Notes (Signed)
[facility] Emergency Department Provider Note  ____________________________________________  Time seen: Approximately 11:35 AM  I have reviewed the triage vital signs and the nursing notes.   HISTORY  Chief Complaint Fever (cough, congested)   HPI Ryan Dudley is a 39 y.o. male who presents to the emergency department for treatment and evaluation of 3 days of cough, body aches, headache, congestion.  No relief with TheraFlu.    History reviewed. No pertinent past medical history.  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 Authorizing Provider  amphetamine-dextroamphetamine (ADDERALL) 15 MG tablet Take 1 tablet by mouth 2 (two) times daily. 12/27/19 01/26/20  Trey Sailors, PA-C  amphetamine-dextroamphetamine (ADDERALL) 15 MG tablet Take 1 tablet by mouth 2 (two) times daily. 12/27/19   Trey Sailors, PA-C  amphetamine-dextroamphetamine (ADDERALL) 15 MG tablet Take 1 tablet by mouth 2 (two) times daily. 12/27/19   Trey Sailors, PA-C  citalopram (CELEXA) 20 MG tablet TAKE 1 TABLET(20 MG) BY MOUTH DAILY 06/12/19   Trey Sailors, PA-C  guaiFENesin-codeine 100-10 MG/5ML syrup Take 10 mLs by mouth 3 (three) times daily as needed. 03/18/20   Wm Fruchter, Rulon Eisenmenger B, FNP  hydrOXYzine (ATARAX/VISTARIL) 50 MG tablet TAKE 1 TABLET(50 MG) BY MOUTH THREE TIMES DAILY AS NEEDED 08/06/19   Trey Sailors, PA-C  predniSONE (STERAPRED UNI-PAK 21 TAB) 10 MG (21) TBPK tablet Take 6 tablets on the first day and decrease by 1 tablet each day until finished. 03/18/20   Chinita Pester, FNP    Allergies Ceclor [cefaclor]  Family History  Adopted: Yes  Problem Relation Age of Onset   Pneumonia Mother     Social History Social History   Tobacco Use   Smoking status: Former Smoker    Packs/day: 0.75    Types: Cigarettes    Quit date: 05/19/2017    Years since quitting: 2.8   Smokeless tobacco: Former Forensic psychologist Use: Never used  Substance Use Topics   Alcohol use: No   Drug use: No    Review of Systems Constitutional: Positive for fever/chills. normal appetite. ENT: Positive for sore throat. Cardiovascular: Denies chest pain. Respiratory: Intermittent shortness of breath. Positive for cough. Negative for wheezing.  Gastrointestinal: Negative for nausea,  no vomiting.  no diarrhea.  Musculoskeletal: Positive for body aches Skin: Negative for rash. Neurological: Positive for headaches ____________________________________________   PHYSICAL EXAM:  VITAL SIGNS: ED Triage Vitals  Enc Vitals Group     BP 03/18/20 0901 125/85     Pulse Rate 03/18/20 0901 86     Resp 03/18/20 0901 17     Temp 03/18/20 0901 98.4 F (36.9 C)     Temp Source 03/18/20 0901 Oral     SpO2 03/18/20 0901 99 %     Weight 03/18/20 0903 180 lb (81.6 kg)     Height 03/18/20 0903 5\' 8"  (1.727 m)     Head Circumference --      Peak Flow --      Pain Score 03/18/20 0917 3     Pain Loc --  Pain Edu? --      Excl. in GC? --     Constitutional: Alert and oriented. Overall well appearing and in no acute distress. Eyes: Conjunctivae are normal. Ears: TM norml Nose: no sinus congestion noted; no rhinnorhea. Mouth/Throat: Mucous membranes are moist.  Oropharynx erythematous. Tonsils 1+. Uvula midline. Neck: No stridor.  Lymphatic: No cervical lymphadenopathy. Cardiovascular: Normal rate, regular rhythm. Good peripheral circulation. Respiratory: Respirations are even and unlabored.  No retractions. Breath sounds clear. Gastrointestinal: Soft and  nontender.  Musculoskeletal: FROM x 4 extremities.  Neurologic:  Normal speech and language. Skin:  Skin is warm, dry and intact. No rash noted. Psychiatric: Mood and affect are normal. Speech and behavior are normal.  ____________________________________________   LABS (all labs ordered are listed, but only abnormal results are displayed)  Labs Reviewed  RESPIRATORY PANEL BY RT PCR (FLU A&B, COVID) - Abnormal; Notable for the following components:      Result Value   SARS Coronavirus 2 by RT PCR POSITIVE (*)    All other components within normal limits   ____________________________________________  EKG  Not indicated. ____________________________________________  RADIOLOGY  Not indicated ____________________________________________   PROCEDURES  Procedure(s) performed: None  Critical Care performed: No ____________________________________________   INITIAL IMPRESSION / ASSESSMENT AND PLAN / ED COURSE  39 y.o. male presents to the emergency department for treatment and evaluation of symptoms as described in the HPI.  Patient has a baby in the NICU and needs to be Covid tested.  Exam is overall reassuring.  Coarse cough noted.  Plan will be to prescribe prednisone and Robitussin-AC and obtain a Covid/influenza swab.  Patient is to be discharged home and advised to quarantine until results are back and he is symptom-free.    Medications - No data to display  ED Discharge Orders         Ordered    predniSONE (STERAPRED UNI-PAK 21 TAB) 10 MG (21) TBPK tablet        03/18/20 1142    guaiFENesin-codeine 100-10 MG/5ML syrup  3 times daily PRN        03/18/20 1142           Pertinent labs & imaging results that were available during my care of the patient were reviewed by me and considered in my medical decision making (see chart for details).    If controlled substance prescribed during this visit, 12 month history viewed on the [facility] prior to issuing an  initial prescription for Schedule II or III opiod. ____________________________________________   FINAL CLINICAL IMPRESSION(S) / ED DIAGNOSES  Final diagnoses:  Viral syndrome  Encounter for laboratory testing for COVID-19 virus    Note:  This document was prepared using Dragon voice recognition software and may include unintentional dictation errors.    Chinita Pester, FNP 03/18/20 1526    Shaune Pollack, MD 03/25/20 [phone]

## 2020-03-18 NOTE — Telephone Encounter (Signed)
Pt calling for Covid test results, Positive. States he has cough, "Much better with the cough syrup with codeine." States afebrile, "Just cold like symptoms but feeling much better."   Reviewed quarantine precautions; self isolate for 10 days from onset of symptoms, with 24 hours fever free without fever reducing medications. Any respiratory symptoms should be resolved at that time as well. Leave home for medical issues only. Treat any symptoms with over the counter medications. Reviewed household precautions and preventive care measures, including: frequent hand-washing, wiping down of high touch areas ie: doorknobs, counter tops. Isolate, distance from rest of household. Any household members must quarantine as well for 14 days from your test date. Reviewed symptoms which warrant an ED visit. Pt verbalizes understanding.   Will alert [facility]

## 2020-03-19 ENCOUNTER — Telehealth: Payer: Self-pay | Admitting: Family

## 2020-03-19 ENCOUNTER — Telehealth (INDEPENDENT_AMBULATORY_CARE_PROVIDER_SITE_OTHER): Admitting: Physician Assistant

## 2020-03-19 DIAGNOSIS — U071 COVID-19: Secondary | ICD-10-CM

## 2020-03-19 DIAGNOSIS — F909 Attention-deficit hyperactivity disorder, unspecified type: Secondary | ICD-10-CM | POA: Diagnosis not present

## 2020-03-19 NOTE — Telephone Encounter (Signed)
Called to Discuss with patient about Covid symptoms and the use of the monoclonal antibody infusion for those with mild to moderate Covid symptoms and at a high risk of hospitalization.     Pt appears to qualify for this infusion due to co-morbid conditions and/or a member of an at-risk group in accordance with the FDA Emergency Use Authorization.   Mr. Banton was seen at [facility] ED on 8/31 with body aches headache and congestion with onset of symptoms 8/28. Risk factors include BMI >25.   Mr. Garciamartinez is interest in receiving Regeneron however right now he is caring for his 3 children with his wife (currently negative) who is staying in a hotel because they have a newborn in the NICU.  Will follow up.   Marcos Eke, NP

## 2020-03-19 NOTE — Progress Notes (Signed)
MyChart Video Visit    Virtual Visit via Video Note   This visit type was conducted due to national recommendations for restrictions regarding the COVID-19 Pandemic (e.g. social distancing) in an effort to limit this patient's exposure and mitigate transmission in our community. This patient is at least at moderate risk for complications without adequate follow up. This format is felt to be most appropriate for this patient at this time. Physical exam was limited by quality of the video and audio technology used for the visit.   Patient location: Home Provider location: Office   I discussed the limitations of evaluation and management by telemedicine and the availability of in person appointments. The patient expressed understanding and agreed to proceed.   Patient: Ryan Dudley   DOB: 1981/03/03   39 y.o. Male  MRN: [id number] Visit Date: 03/19/2020  Today's healthcare provider: Trey Sailors, PA-C   Chief Complaint  Patient presents with  . Anxiety  . ADHD  I,Ryan Dudley,acting as a scribe for Trey Sailors, PA-C.,have documented all relevant documentation on the behalf of Trey Sailors, PA-C,as directed by  Trey Sailors, PA-C while in the presence of Trey Sailors, PA-C.  Subjective    HPI  Follow up for ADHD  The patient was last seen for this 2 months ago. Changes made at last visit include increased Adderall to 15 mg (take 1 tab by mouth twice daily.).  He reports good compliance with treatment. He feels that condition is Improved. He is not having side effects.   ----------------------------------------------------------------------------------------- Anxiety, Follow-up  He was last seen for anxiety 2 months ago. Changes made at last visit include continue current medication.   He reports good compliance with treatment. He reports good to fair tolerance of treatment. He is not having side effects.   He feels his anxiety is moderate  and Unchanged since last visit.  Symptoms: No chest pain No difficulty concentrating  No dizziness Yes fatigue  No feelings of losing control No insomnia  Yes irritable No palpitations  No panic attacks Yes racing thoughts  No shortness of breath No sweating  No tremors/shakes    GAD-7 Results GAD-7 Generalized Anxiety Disorder Screening Tool 03/19/2020 06/06/2019 02/16/2019  1. Feeling Nervous, Anxious, or on Edge 3 3 3   2. Not Being Able to Stop or Control Worrying 3 3 3   3. Worrying Too Much About Different Things 2 3 2   4. Trouble Relaxing 3 3 1   5. Being So Restless it's Hard To Sit Still 3 0 0  6. Becoming Easily Annoyed or Irritable 3 3 3   7. Feeling Afraid As If Something Awful Might Happen 2 1 2   Total GAD-7 Score 19 16 14   Difficulty At Work, Home, or Getting  Along With Others? Very difficult Very difficult Somewhat difficult    PHQ-9 Scores PHQ9 SCORE ONLY 02/16/2019 11/09/2018 05/12/2017  PHQ-9 Total Score 6 0 0   COVID 19: In the interim, patient has been diagnosed with COVID-19. He was vaccinated with and [place] in July. He is having some sinus congestion, myalgias but overall is not SOB.  ---------------------------------------------------------------------------------------------------      Medications: Outpatient Medications Prior to Visit  Medication Sig  . amphetamine-dextroamphetamine (ADDERALL) 15 MG tablet Take 1 tablet by mouth 2 (two) times daily.  amphetamine-dextroamphetamine (ADDERALL) 15 MG tablet Take 1 tablet by mouth 2 (two) times daily.  . citalopram (CELEXA) 20 MG tablet TAKE 1 TABLET(20 MG) BY MOUTH DAILY  .  guaiFENesin-codeine 100-10 MG/5ML syrup Take 10 mLs by mouth 3 (three) times daily as needed.  . hydrOXYzine (ATARAX/VISTARIL) 50 MG tablet TAKE 1 TABLET(50 MG) BY MOUTH THREE TIMES DAILY AS NEEDED  . predniSONE (STERAPRED UNI-PAK 21 TAB) 10 MG (21) TBPK tablet Take 6 tablets on the first day and decrease by 1 tablet each day  until finished.  . amphetamine-dextroamphetamine (ADDERALL) 15 MG tablet Take 1 tablet by mouth 2 (two) times daily.   No facility-administered medications prior to visit.    Review of Systems  Constitutional: Positive for fatigue.  HENT: Positive for congestion.   Respiratory: Negative.   Neurological: Positive for weakness.  Hematological: Negative.   Psychiatric/Behavioral: Positive for agitation and decreased concentration. Negative for suicidal ideas. The patient is nervous/anxious.       Objective    There were no vitals taken for this visit.   Physical Exam Constitutional:      Appearance: Normal appearance.  Cardiovascular:     Rate and Rhythm: Normal rate and regular rhythm.  Pulmonary:     Effort: Pulmonary effort is normal. No respiratory distress.  Neurological:     Mental Status: He is alert and oriented to person, place, and time. Mental status is at baseline.  Psychiatric:        Mood and Affect: Mood normal.        Behavior: Behavior normal.        Assessment & Plan    1. Attention deficit hyperactivity disorder (ADHD), unspecified ADHD type  Refill medications. Follow up 3 months.  2. COVID-19  Referred for antibody treatment. Return precautions counseled.    No follow-ups on file.     I discussed the assessment and treatment plan with the patient. The patient was provided an opportunity to ask questions and all were answered. The patient agreed with the plan and demonstrated an understanding of the instructions.   The patient was advised to call back or seek an in-person evaluation if the symptoms worsen or if the condition fails to improve as anticipated.   ITrey Sailors, PA-C, have reviewed all documentation for this visit. The documentation on 03/19/20 for the exam, diagnosis, procedures, and orders are all accurate and complete.  The entirety of the information documented in the History of Present Illness, Review of Systems and  Physical Exam were personally obtained by me. Portions of this information were initially documented by [facility] and reviewed by me for thoroughness and accuracy.    Maryella Shivers [facility] [phone] (phone) [phone] (fax)  [facility]

## 2020-03-24 ENCOUNTER — Encounter: Payer: Self-pay | Admitting: Physician Assistant

## 2020-04-16 ENCOUNTER — Other Ambulatory Visit: Payer: Self-pay | Admitting: Physician Assistant

## 2020-04-16 DIAGNOSIS — F411 Generalized anxiety disorder: Secondary | ICD-10-CM

## 2020-04-16 DIAGNOSIS — F909 Attention-deficit hyperactivity disorder, unspecified type: Secondary | ICD-10-CM

## 2020-04-16 NOTE — Telephone Encounter (Signed)
Copied from CRM [id number]. Topic: General - Other >> Apr 16, 2020 12:08 PM Herby Abraham C wrote: Reason for CRM: pt is requesting a refill for amphetamine-dextroamphetamine (ADDERALL) 15 MG tablet  Pharmacy: [id number] [place], [place] - [address] AT [facility] OF [address]  Phone:  [phone] Fax:  [phone]

## 2020-04-16 NOTE — Telephone Encounter (Signed)
Approved per protocol.  Requested Prescriptions  Pending Prescriptions Disp Refills   hydrOXYzine (ATARAX/VISTARIL) 50 MG tablet [Pharmacy Med Name: HYDROXYZINE HCL 50MG  TABS (WHITE)] 30 tablet 1    Sig: TAKE 1 TABLET(50 MG) BY MOUTH THREE TIMES DAILY AS NEEDED     Ear, Nose, and Throat:  Antihistamines Passed - 04/16/2020 12:11 PM      Passed - Valid encounter within last 12 months    Recent Outpatient Visits          4 weeks ago Attention deficit hyperactivity disorder (ADHD), unspecified ADHD type   [facility] [place], [place], PA-C   3 months ago Attention deficit hyperactivity disorder (ADHD), unspecified ADHD type   [facility] [place], [place] M, M   8 months ago Attention deficit   [facility] M, M   9 months ago Attention deficit   [facility] M, M   10 months ago Other fatigue   [facility] [place], [place], [place]

## 2020-04-16 NOTE — Telephone Encounter (Signed)
Requested medication (s) are due for refill today: Yes  Requested medication (s) are on the active medication list: Yes  Last refill:  12/27/19  Future visit scheduled: No  Notes to clinic:  See request.    Requested Prescriptions  Pending Prescriptions Disp Refills   amphetamine-dextroamphetamine (ADDERALL) 15 MG tablet 60 tablet 0    Sig: Take 1 tablet by mouth 2 (two) times daily.      Not Delegated - Psychiatry:  Stimulants/ADHD Failed - 04/16/2020  4:42 PM      Failed - This refill cannot be delegated      Failed - Urine Drug Screen completed in last 360 days.      Passed - Valid encounter within last 3 months    Recent Outpatient Visits           4 weeks ago Attention deficit hyperactivity disorder (ADHD), unspecified ADHD type   [facility] [place], [place]   3 months ago Attention deficit hyperactivity disorder (ADHD), unspecified ADHD type   [facility] [place]st [place], [place]   8 months ago Attention deficit   [facility] [place], [place]   9 months ago Attention deficit   [facility] [place]st [place], [place]   10 months ago Other fatigue   [facility] [place], [place]i [place], [place]

## 2020-04-18 MED ORDER — AMPHETAMINE-DEXTROAMPHETAMINE 15 MG PO TABS: 15.0000 mg | ORAL_TABLET | Freq: Two times a day (BID) | ORAL | 0 refills | Status: DC

## 2020-05-13 ENCOUNTER — Encounter: Payer: Self-pay | Admitting: Physician Assistant

## 2020-05-13 ENCOUNTER — Other Ambulatory Visit: Payer: Self-pay

## 2020-05-13 ENCOUNTER — Ambulatory Visit: Payer: 59 | Admitting: Physician Assistant

## 2020-05-13 VITALS — BP 135/95 | HR 57 | Resp 16 | Wt 176.0 lb

## 2020-05-13 DIAGNOSIS — Z3009 Encounter for other general counseling and advice on contraception: Secondary | ICD-10-CM | POA: Diagnosis not present

## 2020-05-13 DIAGNOSIS — R21 Rash and other nonspecific skin eruption: Secondary | ICD-10-CM

## 2020-05-13 DIAGNOSIS — F411 Generalized anxiety disorder: Secondary | ICD-10-CM

## 2020-05-13 DIAGNOSIS — M25571 Pain in right ankle and joints of right foot: Secondary | ICD-10-CM | POA: Diagnosis not present

## 2020-05-13 DIAGNOSIS — R1033 Periumbilical pain: Secondary | ICD-10-CM | POA: Diagnosis not present

## 2020-05-13 LAB — POCT URINALYSIS DIPSTICK
Bilirubin, UA: NEGATIVE
Blood, UA: NEGATIVE
Glucose, UA: NEGATIVE
Ketones, UA: NEGATIVE
Leukocytes, UA: NEGATIVE
Nitrite, UA: NEGATIVE
Protein, UA: NEGATIVE
Spec Grav, UA: <=1.005 — AB (ref 1.010–1.025)
Urobilinogen, UA: 0.2 E.U./dL
pH, UA: 8 (ref 5.0–8.0)

## 2020-05-13 MED ORDER — DOXYCYCLINE HYCLATE 100 MG PO TABS: 100.0000 mg | ORAL_TABLET | Freq: Two times a day (BID) | ORAL | 0 refills | Status: AC

## 2020-05-13 NOTE — Progress Notes (Signed)
Established patient visit   Patient: Ryan Dudley   DOB: [birth year]   39 y.o. Male  MRN: [id number] Visit Date: 05/13/2020  Today's healthcare provider: Trey Sailors, PA-C   Chief Complaint  Patient presents with  . Abdominal Pain  . Ankle Pain   Subjective    Abdominal Pain This is a new problem. The problem has been gradually worsening. The pain is located in the periumbilical region. The pain is moderate. The quality of the pain is burning. The abdominal pain does not radiate.    Reports umbilical pain for 5 days that peaked yesterday. Reports the pain was moderate. Patient reports redness and swelling. Yesterday reports area was tender to the touch. Denies fevers, chills, nausea, vomiting. Denies history of hernias. Reports the abdominal pain has improved today.   BP Readings from Last 5 Encounters:  05/15/20 130/85  05/13/20 (!) 135/95  03/18/20 125/85  12/27/19 120/80  07/11/19 128/84    Patient also mentions that he is having pain in his right ankle. He reports that the pain has worsened over the last month. Reports sometimes his right ankle will occasionally give out. He [facility] and was told he had limited movement but was not told he had any issues on his xrays. He reports pain back in his achilles tendon on the right side.   Patient is interested in a vasectomy. He has four children and would not like to have any more children.   Wt Readings from Last 3 Encounters:  05/15/20 175 lb (79.4 kg)  05/13/20 176 lb (79.8 kg)  03/18/20 180 lb (81.6 kg)        Medications: Outpatient Medications Prior to Visit  Medication Sig  . citalopram (CELEXA) 20 MG tablet TAKE 1 TABLET(20 MG) BY MOUTH DAILY  . hydrOXYzine (ATARAX/VISTARIL) 50 MG tablet TAKE 1 TABLET(50 MG) BY MOUTH THREE TIMES DAILY AS NEEDED  . [DISCONTINUED] amphetamine-dextroamphetamine (ADDERALL) 15 MG tablet Take 1 tablet by mouth 2 (two) times daily.  (Patient not taking: Reported on 05/15/2020)  . [DISCONTINUED] amphetamine-dextroamphetamine (ADDERALL) 15 MG tablet Take 1 tablet by mouth 2 (two) times daily. (Patient not taking: Reported on 05/15/2020)  . amphetamine-dextroamphetamine (ADDERALL) 15 MG tablet Take 1 tablet by mouth 2 (two) times daily.  . [DISCONTINUED] guaiFENesin-codeine 100-10 MG/5ML syrup Take 10 mLs by mouth 3 (three) times daily as needed. (Patient not taking: Reported on 05/13/2020)  . [DISCONTINUED] predniSONE (STERAPRED UNI-PAK 21 TAB) 10 MG (21) TBPK tablet Take 6 tablets on the first day and decrease by 1 tablet each day until finished.   No facility-administered medications prior to visit.    Review of Systems  Gastrointestinal: Positive for abdominal pain.      Objective    BP (!) 135/95   Pulse (!) 57   Resp 16   Wt 176 lb (79.8 kg)   BMI 26.76 kg/m    Physical Exam Constitutional:      Appearance: Normal appearance.  Cardiovascular:     Rate and Rhythm: Normal rate and regular rhythm.     Heart sounds: Normal heart sounds.  Pulmonary:     Effort: Pulmonary effort is normal.     Breath sounds: Normal breath sounds.  Abdominal:     General: Abdomen is flat. Bowel sounds are normal.     Palpations: Abdomen is soft.     Tenderness: There is abdominal tenderness in the periumbilical area.  Musculoskeletal:     Right ankle: Normal.     Right Achilles Tendon: Normal.     Left ankle: Normal.     Left Achilles Tendon: Normal.  Skin:    General: Skin is warm and dry.  Neurological:     Mental Status: He is alert and oriented to person, place, and time. Mental status is at baseline.  Psychiatric:        Mood and Affect: Mood normal.        Behavior: Behavior normal.       Results for orders placed or performed in visit on 05/13/20  Comprehensive Metabolic Panel (CMET)  Result Value Ref Range   Glucose 95 65 - 99 mg/dL   BUN 9 6 - 20 mg/dL   Creatinine, Ser 0.63 0.76 - 1.27 mg/dL    GFR calc non Af Amer 110 >59 mL/min/1.73   GFR calc Af Amer 127 >59 mL/min/1.73   BUN/Creatinine Ratio 10 9 - 20   Sodium 141 134 - 144 mmol/L   Potassium 3.9 3.5 - 5.2 mmol/L   Chloride 101 96 - 106 mmol/L   CO2 25 20 - 29 mmol/L   Calcium 9.7 8.7 - 10.2 mg/dL   Total Protein 6.7 6.0 - 8.5 g/dL   Albumin 4.6 4.0 - 5.0 g/dL   Globulin, Total 2.1 1.5 - 4.5 g/dL   Albumin/Globulin Ratio 2.2 1.2 - 2.2   Bilirubin Total 0.8 0.0 - 1.2 mg/dL   Alkaline Phosphatase 76 44 - 121 IU/L   AST 31 0 - 40 IU/L   ALT 43 0 - 44 IU/L  CBC with Differential  Result Value Ref Range   WBC 6.1 3.4 - 10.8 x10E3/uL   RBC 4.95 4.14 - 5.80 x10E6/uL   Hemoglobin 15.6 13.0 - 17.7 g/dL   Hematocrit 01.6 01.0 - 51.0 %   MCV 89 79 - 97 fL   MCH 31.5 26.6 - 33.0 pg   MCHC 35.5 31 - 35 g/dL   RDW 93.2 35.5 - 73.2 %   Platelets 251 150 - 450 x10E3/uL   Neutrophils 60 Not Estab. %   Lymphs 29 Not Estab. %   Monocytes 8 Not Estab. %   Eos 2 Not Estab. %   Basos 1 Not Estab. %   Neutrophils Absolute 3.7 1.40 - 7.00 x10E3/uL   Lymphocytes Absolute 1.8 0 - 3 x10E3/uL   Monocytes Absolute 0.5 0 - 0 x10E3/uL   EOS (ABSOLUTE) 0.1 0.0 - 0.4 x10E3/uL   Basophils Absolute 0.0 0 - 0 x10E3/uL   Immature Granulocytes 0 Not Estab. %   Immature Grans (Abs) 0.0 0.0 - 0.1 x10E3/uL  C-reactive protein  Result Value Ref Range   CRP 1 0 - 10 mg/L  Lipase  Result Value Ref Range   Lipase 29 13 - 78 U/L  Amylase  Result Value Ref Range   Amylase 56 31 - 110 U/L  POCT urinalysis dipstick  Result Value Ref Range   Color, UA yellow    Clarity, UA clear    Glucose, UA Negative Negative   Bilirubin, UA negative    Ketones, UA negative    Spec Grav, UA <=1.005 (A) 1.010 - 1.025   Blood, UA negative    pH, UA 8.0 5.0 - 8.0   Protein, UA Negative Negative   Urobilinogen, UA 0.2 0.2 or 1.0 E.U./dL   Nitrite, UA negative    Leukocytes, UA Negative Negative    Assessment & Plan  1. Periumbilical abdominal  pain  There is a fairly prominent rash that surrounds the umbilicus. Will treat as below for cellulitis and evaluate with labwork. No discrete mass felt and no tenderness elsewhere on the abdomen.  - doxycycline (VIBRA-TABS) 100 MG tablet; Take 1 tablet (100 mg total) by mouth 2 (two) times daily for 7 days.  Dispense: 14 tablet; Refill: 0 - Comprehensive Metabolic Panel (CMET) - CBC with Differential - C-reactive protein - Lipase - Amylase - POCT urinalysis dipstick  2. Vasectomy evaluation  - Ambulatory referral to Urology  3. Acute right ankle pain  - Ambulatory referral to Orthopedics  4. Skin rash  - doxycycline (VIBRA-TABS) 100 MG tablet; Take 1 tablet (100 mg total) by mouth 2 (two) times daily for 7 days.  Dispense: 14 tablet; Refill: 0  5. GAD (generalized anxiety disorder)  Continue celexa.   No follow-ups on file.      ITrey Sailors, PA-C, have reviewed all documentation for this visit. The documentation on 05/15/20 for the exam, diagnosis, procedures, and orders are all accurate and complete.  The entirety of the information documented in the History of Present Illness, Review of Systems and Physical Exam were personally obtained by me. Portions of this information were initially documented by Anson [place], CMA and reviewed by me for thoroughness and accuracy.     Maryella Shivers  [facility] [phone] (phone) [phone] (fax)  [facility]

## 2020-05-13 NOTE — Patient Instructions (Addendum)

## 2020-05-14 LAB — COMPREHENSIVE METABOLIC PANEL
ALT: 43 IU/L (ref 0–44)
AST: 31 IU/L (ref 0–40)
Albumin/Globulin Ratio: 2.2 (ref 1.2–2.2)
Albumin: 4.6 g/dL (ref 4.0–5.0)
Alkaline Phosphatase: 76 IU/L (ref 44–121)
BUN/Creatinine Ratio: 10 (ref 9–20)
BUN: 9 mg/dL (ref 6–20)
Bilirubin Total: 0.8 mg/dL (ref 0.0–1.2)
CO2: 25 mmol/L (ref 20–29)
Calcium: 9.7 mg/dL (ref 8.7–10.2)
Chloride: 101 mmol/L (ref 96–106)
Creatinine, Ser: 0.88 mg/dL (ref 0.76–1.27)
GFR calc Af Amer: 127 mL/min/{1.73_m2} (ref >59)
GFR calc non Af Amer: 110 mL/min/{1.73_m2} (ref >59)
Globulin, Total: 2.1 g/dL (ref 1.5–4.5)
Glucose: 95 mg/dL (ref 65–99)
Potassium: 3.9 mmol/L (ref 3.5–5.2)
Sodium: 141 mmol/L (ref 134–144)
Total Protein: 6.7 g/dL (ref 6.0–8.5)

## 2020-05-14 LAB — CBC WITH DIFFERENTIAL/PLATELET
Basophils Absolute: 0 10*3/uL (ref 0.0–0.2)
Basos: 1 %
EOS (ABSOLUTE): 0.1 10*3/uL (ref 0.0–0.4)
Eos: 2 %
Hematocrit: 43.9 % (ref 37.5–51.0)
Hemoglobin: 15.6 g/dL (ref 13.0–17.7)
Immature Grans (Abs): 0 10*3/uL (ref 0.0–0.1)
Immature Granulocytes: 0 %
Lymphocytes Absolute: 1.8 10*3/uL (ref 0.7–3.1)
Lymphs: 29 %
MCH: 31.5 pg (ref 26.6–33.0)
MCHC: 35.5 g/dL (ref 31.5–35.7)
MCV: 89 fL (ref 79–97)
Monocytes Absolute: 0.5 10*3/uL (ref 0.1–0.9)
Monocytes: 8 %
Neutrophils Absolute: 3.7 10*3/uL (ref 1.4–7.0)
Neutrophils: 60 %
Platelets: 251 10*3/uL (ref 150–450)
RBC: 4.95 x10E6/uL (ref 4.14–5.80)
RDW: 12.1 % (ref 11.6–15.4)
WBC: 6.1 10*3/uL (ref 3.4–10.8)

## 2020-05-14 LAB — LIPASE: Lipase: 29 U/L (ref 13–78)

## 2020-05-14 LAB — AMYLASE: Amylase: 56 U/L (ref 31–110)

## 2020-05-14 LAB — C-REACTIVE PROTEIN: CRP: 1 mg/L (ref 0–10)

## 2020-05-14 NOTE — Progress Notes (Addendum)
Duplicate

## 2020-05-15 ENCOUNTER — Encounter: Payer: Self-pay | Admitting: Urology

## 2020-05-15 ENCOUNTER — Other Ambulatory Visit: Payer: Self-pay

## 2020-05-15 ENCOUNTER — Ambulatory Visit (INDEPENDENT_AMBULATORY_CARE_PROVIDER_SITE_OTHER): Payer: 59 | Admitting: Urology

## 2020-05-15 VITALS — BP 130/85 | HR 112 | Ht 68.0 in | Wt 175.0 lb

## 2020-05-15 DIAGNOSIS — Z3009 Encounter for other general counseling and advice on contraception: Secondary | ICD-10-CM | POA: Diagnosis not present

## 2020-05-15 MED ORDER — DIAZEPAM 10 MG PO TABS: ORAL_TABLET | ORAL | 0 refills | Status: DC

## 2020-05-15 NOTE — Patient Instructions (Signed)

## 2020-05-15 NOTE — Progress Notes (Signed)
05/15/2020 1:44 PM   Bobbe Medico [birth year] [id number]  Referring provider: Trey Sailors, PA-C [address] [place],  [place] [address]  Chief Complaint  Patient presents with  . VAS Consult    [id number] Whilden is a 39 y.o. male who presents for vasectomy counseling.  . Married with 4 children . Denies prior history urologic problems including chronic scrotal pain, epididymitis or orchitis . No previous history inguinal/pelvic hernia surgery . No history of bleeding or clotting disorders   PMH: No past medical history on file.  Surgical History: Past Surgical History:  Procedure Laterality Date  . MOUTH SURGERY      Home Medications:  Allergies as of 05/15/2020      Reactions   Ceclor [cefaclor]    States he was administered this while hospitalized without reaction.      Medication List       Accurate as of May 15, 2020  1:44 PM. If you have any questions, ask your nurse or doctor.        amphetamine-dextroamphetamine 15 MG tablet Commonly known as: Adderall Take 1 tablet by mouth 2 (two) times daily.   amphetamine-dextroamphetamine 15 MG tablet Commonly known as: Adderall Take 1 tablet by mouth 2 (two) times daily.   amphetamine-dextroamphetamine 15 MG tablet Commonly known as: Adderall Take 1 tablet by mouth 2 (two) times daily.   citalopram 20 MG tablet Commonly known as: CELEXA TAKE 1 TABLET(20 MG) BY MOUTH DAILY   doxycycline 100 MG tablet Commonly known as: VIBRA-TABS Take 1 tablet (100 mg total) by mouth 2 (two) times daily for 7 days.   hydrOXYzine 50 MG tablet Commonly known as: ATARAX/VISTARIL TAKE 1 TABLET(50 MG) BY MOUTH THREE TIMES DAILY AS NEEDED       Allergies:  Allergies  Allergen Reactions  . Ceclor [Cefaclor]     States he was administered this while hospitalized without reaction.    Family History: Family History  Adopted: Yes  Problem Relation Age of Onset  . Pneumonia Mother      Social History:  reports that he quit smoking about 2 years ago. His smoking use included cigarettes. He smoked 0.75 packs per day. He has quit using smokeless tobacco. He reports that he does not drink alcohol and does not use drugs.   Physical Exam: BP 130/85   Pulse (!) 112   Ht 5\' 8"  (1.727 m)   Wt 175 lb (79.4 kg)   BMI 26.61 kg/m   Constitutional:  Alert and oriented, No acute distress. HEENT: [place] AT, moist mucus membranes.  Trachea midline, no masses. Cardiovascular: No clubbing, cyanosis, or edema. Respiratory: Normal respiratory effort, no increased work of breathing. GI: Abdomen is soft, nontender, nondistended, no abdominal masses GU: Phallus without lesions, testes descended bilaterally without masses or tenderness, spermatic cord/epididymis palpably normal bilaterally.  Vasa palpable bilaterally Skin: No rashes, bruises or suspicious lesions. Neurologic: Grossly intact, no focal deficits, moving all 4 extremities. Psychiatric: Normal mood and affect.   Assessment & Plan:    1.  Undesired fertility . Desires to schedule vasectomy . We had a long discussion about vasectomy. We specifically discussed the procedure, recovery and the risks, benefits and alternatives of vasectomy. I explained that the procedure entails removal of a segment of each vas deferens, each of which conducts sperm, and that the purpose of this procedure is to cause sterility (inability to produce children or cause pregnancy). Vasectomy is intended to be permanent and irreversible form of contraception.  Options for fertility after vasectomy include vasectomy reversal, or sperm retrieval with in vitro fertilization. These options are not always successful, and they may be expensive. We discussed reversible forms of birth control such as condoms, IUD or diaphragms, as well as the option of freezing sperm in a sperm bank prior to the vasectomy procedure. We discussed the importance of avoiding strenuous  exercise for four days after vasectomy, and the importance of refraining from any form of ejaculation for seven days after vasectomy. I explained that vasectomy does not produce immediate sterility so another form of contraceptive must be used until sterility is assured by having semen checked for sperm. Thus, a post vasectomy semen analysis is necessary to confirm sterility. Rarely, vasectomy must be repeated. We discussed the approximately 1 in 2,000 risk of pregnancy after vasectomy for men who have post-vasectomy semen analysis showing absent sperm or rare non-motile sperm. Typical side effects include a small amount of oozing blood, some discomfort and mild swelling in the area of incision.  Vasectomy does not affect sexual performance, function, please, sensation, interest, desire, satisfaction, penile erection, volume of semen or ejaculation. Other rare risks include allergy or adverse reaction to an anesthetic, testicular atrophy, hematoma, infection/abscess, prolonged tenderness of the vas deferens, pain, swelling, painful nodule or scar (called sperm granuloma) or epididymtis. We discussed chronic testicular pain syndrome. This has been reported to occur in as many as 1-2% of men and may be permanent. This can be treated with medication, small procedures or (rarely) surgery. . Valium 10 mg as a preprocedure anxiolytic sent to pharmacy and he was informed he would need a driver if utilizing this medication   I, Theador Hawthorne, am acting as a scribe for Dr. Lorin Picket C. Alvaretta Eisenberger,  I have reviewed the above documentation for accuracy and completeness, and I agree with the above.   Riki Altes, MD   [facility] [address], [address] [place], [place] [address] [phone]

## 2020-05-20 ENCOUNTER — Emergency Department: Payer: 59

## 2020-05-20 ENCOUNTER — Other Ambulatory Visit: Payer: Self-pay

## 2020-05-20 ENCOUNTER — Other Ambulatory Visit: Payer: Self-pay | Admitting: Physician Assistant

## 2020-05-20 DIAGNOSIS — F411 Generalized anxiety disorder: Secondary | ICD-10-CM

## 2020-05-20 DIAGNOSIS — R030 Elevated blood-pressure reading, without diagnosis of hypertension: Secondary | ICD-10-CM | POA: Insufficient documentation

## 2020-05-20 DIAGNOSIS — R0789 Other chest pain: Secondary | ICD-10-CM | POA: Diagnosis not present

## 2020-05-20 DIAGNOSIS — Z5321 Procedure and treatment not carried out due to patient leaving prior to being seen by health care provider: Secondary | ICD-10-CM | POA: Insufficient documentation

## 2020-05-20 LAB — CBC
HCT: 43.4 % (ref 39.0–52.0)
Hemoglobin: 15.3 g/dL (ref 13.0–17.0)
MCH: 30.8 pg (ref 26.0–34.0)
MCHC: 35.3 g/dL (ref 30.0–36.0)
MCV: 87.5 fL (ref 80.0–100.0)
Platelets: 248 10*3/uL (ref 150–400)
RBC: 4.96 MIL/uL (ref 4.22–5.81)
RDW: 11.8 % (ref 11.5–15.5)
WBC: 8.2 10*3/uL (ref 4.0–10.5)
nRBC: 0 % (ref 0.0–0.2)

## 2020-05-20 LAB — BASIC METABOLIC PANEL
Anion gap: 16 — ABNORMAL HIGH (ref 5–15)
BUN: 9 mg/dL (ref 6–20)
CO2: 21 mmol/L — ABNORMAL LOW (ref 22–32)
Calcium: 9.3 mg/dL (ref 8.9–10.3)
Chloride: 102 mmol/L (ref 98–111)
Creatinine, Ser: 0.88 mg/dL (ref 0.61–1.24)
GFR, Estimated: >60 mL/min (ref >60)
Glucose, Bld: 134 mg/dL — ABNORMAL HIGH (ref 70–99)
Potassium: 3.4 mmol/L — ABNORMAL LOW (ref 3.5–5.1)
Sodium: 139 mmol/L (ref 135–145)

## 2020-05-20 LAB — TROPONIN I (HIGH SENSITIVITY)
Troponin I (High Sensitivity): 3 ng/L (ref <18)
Troponin I (High Sensitivity): 3 ng/L (ref <18)

## 2020-05-20 NOTE — ED Triage Notes (Signed)
Pt reports that he has been having elevated blood pressures recently and states that today he is feeling pressure in his chest, states that he is uncertain if he is having a panic attack or if his bp is elevated

## 2020-05-21 ENCOUNTER — Encounter: Payer: Self-pay | Admitting: Physician Assistant

## 2020-05-21 ENCOUNTER — Other Ambulatory Visit: Payer: Self-pay

## 2020-05-21 ENCOUNTER — Emergency Department
Admission: EM | Admit: 2020-05-21 | Discharge: 2020-05-21 | Disposition: A | Discharge status: Home / Self Care | Payer: 59 | Attending: Emergency Medicine | Admitting: Emergency Medicine

## 2020-05-21 ENCOUNTER — Ambulatory Visit: Payer: 59 | Admitting: Physician Assistant

## 2020-05-21 VITALS — BP 142/86 | HR 84 | Resp 16 | Ht 68.0 in | Wt 173.2 lb

## 2020-05-21 DIAGNOSIS — R030 Elevated blood-pressure reading, without diagnosis of hypertension: Secondary | ICD-10-CM | POA: Diagnosis not present

## 2020-05-21 DIAGNOSIS — F411 Generalized anxiety disorder: Secondary | ICD-10-CM

## 2020-05-21 DIAGNOSIS — E876 Hypokalemia: Secondary | ICD-10-CM | POA: Diagnosis not present

## 2020-05-21 NOTE — Progress Notes (Signed)
Vasectomy Procedure Note  Indications: The patient is a 39 y.o. male who presents today for elective sterilization.  He has been consented for the procedure.  He is aware of the risks and benefits.  He had no additional questions.  He agrees to proceed.  He denies any other significant change since his last visit.  Pre-operative Diagnosis: Elective sterilization  Post-operative Diagnosis: Elective sterilization  Premedication: Valium 10 mg po  Surgeon: Lorin Picket C. Matilde Pottenger, M.D  Description: The patient was prepped and draped in the standard fashion.  The right vas deferens was identified and brought superiorly to the anterior scrotal skin.  The skin and vas was then anesthetized utilizing 7 ml 1% lidocaine.  A small stab incision was made and spread with the vas dissector.  The vas was grasped utilizing the vas clamp and elevated out of the incision.  The vas was dissected free from surrounding tissue and vessels and an ~1 cm segment was excised.  The vas lumens were cauterized utilizing electrocautery.  The distal segment was buried in the surrounding sheath with a 3-0 chromic suture.  No significant bleeding was observed.  The vas ends were then dropped back into the hemiscrotum.  The skin was closed with hemostatic pressure.  An identical procedure was performed on the contralateral side.  Clean dry gauze was applied to the incision sites.  The patient tolerated the procedure well.  Complications:None  Recommendations: 1.  No lifting greater than 10 pounds or strenuousactivity for 1 week. 2.  Scrotal support for 1 week. 3.  Shower only for 1 week; may shower in the morning 4.  May resume intercourse in one week if no significant discomfort.  Continue alternate contraception for 12 weeks.  5.  Call for significant pain, swelling, redness, drainage or fever greater than 100.5. 6.  Rx hydrocodone/APAP 5/325 1-2 every 6 hours as needed for pain. 7.  Follow-up semen analysis in 12  weeks.  Irineo Axon, MD

## 2020-05-21 NOTE — Progress Notes (Signed)
Established patient visit   Patient: Ryan Dudley   DOB: 03-18-1981   39 y.o. Male  MRN: [id number] Visit Date: 05/21/2020  Today's healthcare provider: Trey Sailors, PA-C   Chief Complaint  Patient presents with  . Follow-up   Subjective    HPI  Follow up ER visit  Patient was seen in ER for chest pain on 05/20/2020. He was treated for LWBS. Treatment for this included Labs, EKG. He reports this condition is Unchanged. He left before being seen. His CMET showed an anion gap of 16, potassium 3.4. His EKG was unremarkable.   Anxiety, Follow-up  He was last seen for anxiety 1 weeks ago. Changes made at last visit include continue celexa 20 mg.   He reports good compliance with treatment. He reports good tolerance of treatment. He is not having side effects.   He feels his anxiety is moderate and Unchanged since last visit. Recent birth of daughter, born prematurely and required NICU stay. Three other young boys in the house, some having difficulties in school. Continues working at demanding job, has recently left the [employer] for [employer] work.   Symptoms: Yes chest pain Yes difficulty concentrating  No dizziness No fatigue  No feelings of losing control No insomnia  Yes irritable No palpitations  No panic attacks No racing thoughts  No shortness of breath No sweating  No tremors/shakes    GAD-7 Results GAD-7 Generalized Anxiety Disorder Screening Tool 03/19/2020 06/06/2019 02/16/2019  1. Feeling Nervous, Anxious, or on Edge 3 3 3   2. Not Being Able to Stop or Control Worrying 3 3 3   3. Worrying Too Much About Different Things 2 3 2   4. Trouble Relaxing 3 3 1   5. Being So Restless it's Hard To Sit Still 3 0 0  6. Becoming Easily Annoyed or Irritable 3 3 3   7. Feeling Afraid As If Something Awful Might Happen 2 1 2   Total GAD-7 Score 19 16 14   Difficulty At Work, Home, or Getting  Along With Others? Very difficult Very difficult Somewhat difficult     PHQ-9 Scores PHQ9 SCORE ONLY 05/21/2020 05/13/2020 02/16/2019  PHQ-9 Total Score 8 0 6    ---------------------------------------------------------------------------------------------------  -----------------------------------------------------------------------------------------    Patient Active Problem List   Diagnosis Date Noted  . S/P left knee arthroscopy 04/16/2019  . GAD (generalized anxiety disorder) 02/16/2019  . 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 Bradycardia 08/19/2017  . Palpitations 08/19/2017  . Tobacco abuse 05/12/2017   Social History   Tobacco Use  . Smoking status: Former Smoker    Packs/day: 0.75    Types: Cigarettes    Quit date: 05/19/2017    Years since quitting: 3.0  . Smokeless tobacco: Former 04/18/2019  . Vaping Use: Never used  Substance Use Topics  . Alcohol use: No  . Drug use: No   Allergies  Allergen Reactions  . Ceclor [Cefaclor]     States he was administered this while hospitalized without reaction.       Medications: Outpatient Medications Prior to Visit  Medication Sig  . citalopram (CELEXA) 20 MG tablet TAKE 1 TABLET(20 MG) BY MOUTH DAILY  . diazepam (VALIUM) 10 MG tablet 1 tab po 30 min prior to procedure  . hydrOXYzine (ATARAX/VISTARIL) 50 MG tablet TAKE 1 TABLET(50 MG) BY MOUTH THREE TIMES DAILY AS NEEDED  . amphetamine-dextroamphetamine (ADDERALL) 15 MG tablet Take 1 tablet by mouth 2 (two) times daily.   No facility-administered  medications prior to visit.    Review of Systems  Constitutional: Positive for fatigue. Negative for chills and fever.  Respiratory: Positive for chest tightness and shortness of breath. Negative for cough.   Cardiovascular: Positive for chest pain and palpitations.  Gastrointestinal: Negative for abdominal distention, constipation, diarrhea, nausea and vomiting.  Neurological: Positive for dizziness and headaches.  Psychiatric/Behavioral: The patient is  nervous/anxious.       Objective    BP (!) 142/86 (BP Location: Left Arm, Patient Position: Sitting, Cuff Size: Normal)   Pulse 84   Resp 16   Ht 5\' 8"  (1.727 m)   Wt 173 lb 3.2 oz (78.6 kg)   SpO2 100%   BMI 26.33 kg/m  BP Readings from Last 3 Encounters:  05/21/20 (!) 142/86  05/21/20 134/82  05/15/20 130/85   Wt Readings from Last 3 Encounters:  05/21/20 173 lb 3.2 oz (78.6 kg)  05/20/20 170 lb (77.1 kg)  05/15/20 175 lb (79.4 kg)      Physical Exam Cardiovascular:     Rate and Rhythm: Normal rate and regular rhythm.     Heart sounds: Normal heart sounds.  Pulmonary:     Effort: Pulmonary effort is normal.     Breath sounds: Normal breath sounds.  Skin:    General: Skin is warm and dry.  Neurological:     Mental Status: He is alert and oriented to person, place, and time. Mental status is at baseline.  Psychiatric:        Mood and Affect: Mood is anxious.       Results for orders placed or performed during the hospital encounter of 05/21/20  Basic metabolic panel  Result Value Ref Range   Sodium 139 135 - 145 mmol/L   Potassium 3.4 (L) 3.5 - 5.1 mmol/L   Chloride 102 98 - 111 mmol/L   CO2 21 (L) 22 - 32 mmol/L   Glucose, Bld 134 (H) 70 - 99 mg/dL   BUN 9 6 - 20 mg/dL   Creatinine, Ser 13/03/21 0.61 - 1.24 mg/dL   Calcium 9.3 8.9 - 3.82 mg/dL   GFR, Estimated 50.5 >39 mL/min   Anion gap 16 (H) 5 - 15  CBC  Result Value Ref Range   WBC 8.2 4.0 - 10.5 K/uL   RBC 4.96 4.22 - 5.81 MIL/uL   Hemoglobin 15.3 13.0 - 17.0 g/dL   HCT >76 39 - 52 %   MCV 87.5 80.0 - 100.0 fL   MCH 30.8 26.0 - 34.0 pg   MCHC 35.3 30.0 - 36.0 g/dL   RDW 73.4 19.3 - 79.0 %   Platelets 248 150 - 400 K/uL   nRBC 0.0 0.0 - 0.2 %  Troponin I (High Sensitivity)  Result Value Ref Range   Troponin I (High Sensitivity) 3 <18 ng/L  Troponin I (High Sensitivity)  Result Value Ref Range   Troponin I (High Sensitivity) 3 <18 ng/L    Assessment & Plan    1. GAD (generalized anxiety  disorder)  His ER workup was normal, though he did not stay to be seen due to prolonged wait times. Will refer to counseling for increased stressors.   - Ambulatory referral to Psychology  2. Elevated blood pressure reading   3. Low blood potassium  Recheck CMET.   - Comprehensive Metabolic Panel (CMET)   No follow-ups on file.      I24.0, PA-C, have reviewed all documentation for this visit. The documentation on 05/28/20  for the exam, diagnosis, procedures, and orders are all accurate and complete.  The entirety of the information documented in the History of Present Illness, Review of Systems and Physical Exam were personally obtained by me. Portions of this information were initially documented by [facility] and reviewed by me for thoroughness and accuracy.   I spent billing minutes dedicated to the care of this patient on the date of this encounter to include pre-visit review of records, face-to-face time with the patient discussing chest pain, anxiety, and post visit ordering of testing.    Maryella Shivers  [facility] [phone] (phone) [phone] (fax)  [facility]

## 2020-05-21 NOTE — Patient Instructions (Signed)

## 2020-05-22 ENCOUNTER — Encounter: Payer: Self-pay | Admitting: Urology

## 2020-05-22 ENCOUNTER — Ambulatory Visit (INDEPENDENT_AMBULATORY_CARE_PROVIDER_SITE_OTHER): Payer: 59 | Admitting: Urology

## 2020-05-22 ENCOUNTER — Other Ambulatory Visit: Payer: Self-pay

## 2020-05-22 VITALS — BP 129/79 | HR 81 | Ht 72.0 in | Wt 173.0 lb

## 2020-05-22 DIAGNOSIS — Z302 Encounter for sterilization: Secondary | ICD-10-CM | POA: Diagnosis not present

## 2020-05-22 MED ORDER — HYDROCODONE-ACETAMINOPHEN 5-325 MG PO TABS
1.0000 | ORAL_TABLET | Freq: Four times a day (QID) | ORAL | 0 refills | Status: DC | PRN
Start: 2020-05-22 — End: 2021-07-07

## 2020-05-22 NOTE — Patient Instructions (Signed)

## 2020-05-26 ENCOUNTER — Encounter: Payer: Self-pay | Admitting: Urology

## 2020-06-11 ENCOUNTER — Encounter: Payer: Self-pay | Admitting: Physician Assistant

## 2020-06-11 DIAGNOSIS — F909 Attention-deficit hyperactivity disorder, unspecified type: Secondary | ICD-10-CM

## 2020-06-11 MED ORDER — AMPHETAMINE-DEXTROAMPHETAMINE 15 MG PO TABS: 15.0000 mg | ORAL_TABLET | Freq: Two times a day (BID) | ORAL | 0 refills | Status: DC

## 2020-08-04 ENCOUNTER — Encounter: Payer: Self-pay | Admitting: Physician Assistant

## 2020-08-04 DIAGNOSIS — F909 Attention-deficit hyperactivity disorder, unspecified type: Secondary | ICD-10-CM

## 2020-08-04 MED ORDER — AMPHETAMINE-DEXTROAMPHETAMINE 15 MG PO TABS: 15.0000 mg | ORAL_TABLET | Freq: Two times a day (BID) | ORAL | 0 refills | Status: DC

## 2020-08-20 ENCOUNTER — Other Ambulatory Visit: Payer: Self-pay

## 2020-08-20 DIAGNOSIS — Z302 Encounter for sterilization: Secondary | ICD-10-CM

## 2020-08-22 ENCOUNTER — Encounter: Payer: Self-pay | Admitting: Urology

## 2020-08-22 ENCOUNTER — Other Ambulatory Visit: Payer: 59

## 2020-09-30 ENCOUNTER — Encounter: Payer: Self-pay | Admitting: Physician Assistant

## 2020-09-30 DIAGNOSIS — F909 Attention-deficit hyperactivity disorder, unspecified type: Secondary | ICD-10-CM

## 2020-10-02 MED ORDER — AMPHETAMINE-DEXTROAMPHETAMINE 15 MG PO TABS: 15.0000 mg | ORAL_TABLET | Freq: Two times a day (BID) | ORAL | 0 refills | Status: DC

## 2020-11-07 ENCOUNTER — Encounter: Payer: Self-pay | Admitting: Family Medicine

## 2020-11-07 DIAGNOSIS — G4733 Obstructive sleep apnea (adult) (pediatric): Secondary | ICD-10-CM | POA: Insufficient documentation

## 2020-12-04 ENCOUNTER — Telehealth: Payer: Self-pay | Admitting: Physician Assistant

## 2020-12-04 NOTE — Telephone Encounter (Signed)
Ryan Dudley with [facility] called to provide an approval for patient for continious airway pressure device.  Thy will be faxing the approval  CB#  [phone]  Ref [id number]

## 2021-03-13 NOTE — Progress Notes (Deleted)
      Established patient visit   Patient: Ryan Dudley   DOB: February 09, 1981   40 y.o. Male  MRN: [id number] Visit Date: 03/16/2021  Today's healthcare provider: Megan Mans, MD   No chief complaint on file.  Subjective  -------------------------------------------------------------------------------------------------------------------- Back Pain Pertinent negatives include no abdominal pain, chest pain or fever.   ***  {Show patient history (optional):23778}   Medications: Outpatient Medications Prior to Visit  Medication Sig   amphetamine-dextroamphetamine (ADDERALL) 15 MG tablet Take 1 tablet by mouth 2 (two) times daily.   citalopram (CELEXA) 20 MG tablet TAKE 1 TABLET(20 MG) BY MOUTH DAILY   diazepam (VALIUM) 10 MG tablet 1 tab po 30 min prior to procedure   HYDROcodone-acetaminophen (NORCO/VICODIN) 5-325 MG tablet Take 1 tablet by mouth every 6 (six) hours as needed for moderate pain.   hydrOXYzine (ATARAX/VISTARIL) 50 MG tablet TAKE 1 TABLET(50 MG) BY MOUTH THREE TIMES DAILY AS NEEDED   No facility-administered medications prior to visit.    Review of Systems  Constitutional:  Negative for appetite change, chills and fever.  Respiratory:  Negative for chest tightness, shortness of breath and wheezing.   Cardiovascular:  Negative for chest pain and palpitations.  Gastrointestinal:  Negative for abdominal pain, nausea and vomiting.  Musculoskeletal:  Positive for back pain.   {Labs  Heme  Chem  Endocrine  Serology  Results Review (optional):23779}   Objective  -------------------------------------------------------------------------------------------------------------------- There were no vitals taken for this visit. {Show previous vital signs (optional):23777}   Physical Exam  ***  No results found for any visits on 03/16/21.  Assessment & Plan   ---------------------------------------------------------------------------------------------------------------------- ***  No follow-ups on file.      {provider attestation***:1}   Megan Mans, MD  [facility] [phone] (phone) [phone] (fax)  [facility]

## 2021-03-16 ENCOUNTER — Ambulatory Visit: Payer: 59 | Admitting: Family Medicine

## 2021-04-24 ENCOUNTER — Telehealth: Payer: Self-pay

## 2021-04-24 NOTE — Telephone Encounter (Signed)
Copied from CRM [id number]. Topic: Appointment Scheduling - Scheduling Inquiry for Clinic >> Apr 24, 2021 10:18 AM Daphine Deutscher D wrote: Pt called saying he needs to get in for a cpe or at least a FU to get back on his meds.  He use to see Adriana.  CB#  [phone]

## 2021-05-04 ENCOUNTER — Ambulatory Visit (INDEPENDENT_AMBULATORY_CARE_PROVIDER_SITE_OTHER): Payer: 59 | Admitting: Family Medicine

## 2021-05-04 ENCOUNTER — Encounter: Payer: Self-pay | Admitting: Family Medicine

## 2021-05-04 ENCOUNTER — Other Ambulatory Visit: Payer: Self-pay

## 2021-05-04 VITALS — BP 129/85 | HR 69 | Temp 98.5°F | Resp 16 | Wt 168.0 lb

## 2021-05-04 DIAGNOSIS — F909 Attention-deficit hyperactivity disorder, unspecified type: Secondary | ICD-10-CM

## 2021-05-04 DIAGNOSIS — F411 Generalized anxiety disorder: Secondary | ICD-10-CM | POA: Diagnosis not present

## 2021-05-04 DIAGNOSIS — Z23 Encounter for immunization: Secondary | ICD-10-CM | POA: Diagnosis not present

## 2021-05-04 MED ORDER — AMPHETAMINE-DEXTROAMPHETAMINE 15 MG PO TABS: 15.0000 mg | ORAL_TABLET | Freq: Two times a day (BID) | ORAL | 0 refills | Status: DC

## 2021-05-04 MED ORDER — AMPHETAMINE-DEXTROAMPHETAMINE 15 MG PO TABS: 15.0000 mg | ORAL_TABLET | Freq: Every day | ORAL | 0 refills | Status: DC

## 2021-05-04 MED ORDER — HYDROXYZINE HCL 25 MG PO TABS: 25.0000 mg | ORAL_TABLET | Freq: Three times a day (TID) | ORAL | 3 refills | Status: AC | PRN

## 2021-05-04 MED ORDER — SERTRALINE HCL 50 MG PO TABS: 50.0000 mg | ORAL_TABLET | Freq: Every day | ORAL | 3 refills | Status: DC

## 2021-05-04 NOTE — Progress Notes (Signed)
I,April Miller,acting as a scribe for Megan Mans, MD.,have documented all relevant documentation on the behalf of Megan Mans, MD,as directed by  Megan Mans, MD while in the presence of Megan Mans, MD.   Established patient visit   Patient: Ryan Dudley   DOB: 1981/01/11   40 y.o. 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 He needs to be able to focus on his business so his ADD treatment is important to them presently. He has not been taking this recently. Anxiety, Follow-up  He was last seen for anxiety 11 months ago. Changes made at last visit include; on citalopram.   He reports good compliance with treatment. He reports good tolerance of treatment. He is not having side effects. none  He feels his anxiety is moderate and Worse since last visit.  GAD-7 Results GAD-7 Generalized Anxiety Disorder Screening Tool 03/19/2020 06/06/2019 02/16/2019  1. Feeling Nervous, Anxious, or on Edge 3 3 3   2. Not Being Able to Stop or Control Worrying 3 3 3   3. Worrying Too Much About Different Things 2 3 2   4. Trouble Relaxing 3 3 1   5. Being So Restless it's Hard To Sit Still 3 0 0  6. Becoming Easily Annoyed or Irritable 3 3 3   7. Feeling Afraid As If Something Awful Might Happen 2 1 2   Total GAD-7 Score 19 16 14   Difficulty At Work, Home, or Getting  Along With Others? Very difficult Very difficult Somewhat difficult    PHQ-9 Scores PHQ9 SCORE ONLY 05/04/2021 05/21/2020 05/13/2020  PHQ-9 Total Score 13 8 0    --------------------------------------------------------------------------------------------------- Patient also would  like to discuss Adderall.     Medications: Outpatient Medications Prior to Visit  Medication Sig   diazepam (VALIUM) 10 MG tablet 1 tab po 30 min prior to procedure   [DISCONTINUED] hydrOXYzine (ATARAX/VISTARIL) 50 MG tablet TAKE 1 TABLET(50 MG) BY MOUTH THREE TIMES DAILY AS NEEDED   HYDROcodone-acetaminophen (NORCO/VICODIN) 5-325 MG tablet Take 1 tablet by mouth every 6 (six) hours as needed for moderate pain. (Patient not taking: Reported on 05/04/2021)   [DISCONTINUED] amphetamine-dextroamphetamine (ADDERALL) 15 MG tablet Take 1 tablet by mouth 2 (two) times daily.   [DISCONTINUED] citalopram (CELEXA) 20 MG tablet TAKE 1 TABLET(20 MG) BY MOUTH DAILY (Patient not taking: Reported on 05/04/2021)   No facility-administered medications prior to visit.    Review of Systems      Objective    BP 129/85 (BP Location: Left Arm, Patient Position: Sitting, Cuff Size: Large)   Pulse 69   Temp 98.5 F (36.9 C) (Temporal)   Resp 16   Wt 168 lb (76.2 kg)   SpO2 97%   BMI 22.78 kg/m  BP Readings from Last 3 Encounters:  05/04/21 129/85  05/22/20 129/79  05/21/20 (!) 142/86   Wt Readings from Last 3 Encounters:  05/04/21 168 lb (76.2 kg)  05/22/20 173 lb (78.5 kg)  05/21/20 173 lb 3.2 oz (78.6 kg)      Physical Exam Vitals reviewed.  Constitutional:      General: He is not in acute distress.  Appearance: He is well-developed.  HENT:     Head: Normocephalic and atraumatic.     Right Ear: Hearing normal.     Left Ear: Hearing normal.     Nose: Nose normal.  Eyes:     General: Lids are normal. No scleral icterus.       Right eye: No discharge.        Left eye: No discharge.     Conjunctiva/sclera: Conjunctivae normal.  Cardiovascular:     Rate and Rhythm: Normal rate and regular rhythm.     Heart sounds: Normal heart sounds.  Pulmonary:     Effort: Pulmonary effort is normal. No respiratory distress.     Breath sounds: Normal breath sounds.  Skin:    General: Skin is  warm and dry.     Findings: No lesion or rash.  Neurological:     General: No focal deficit present.     Mental Status: He is alert and oriented to person, place, and time.  Psychiatric:        Mood and Affect: Mood normal.        Speech: Speech normal.        Behavior: Behavior normal.        Thought Content: Thought content normal.        Judgment: Judgment normal.      No results found for any visits on 05/04/21.  Assessment & Plan     1. GAD (generalized anxiety disorder) Changed citalopram to sertraline 50 mg qd. Also changes hydroxyzine 50 bid to hydroxyzine 25 TID prn.  Higher dose hydroxyzine made him too sleepy but it did help his anxiety.  Avoid benzodiazepines. - sertraline (ZOLOFT) 50 MG tablet; Take 1 tablet (50 mg total) by mouth daily.  Dispense: 90 tablet; Refill: 3 - hydrOXYzine (ATARAX/VISTARIL) 25 MG tablet; Take 1 tablet (25 mg total) by mouth 3 (three) times daily as needed.  Dispense: 270 tablet; Refill: 3  2. Attention deficit hyperactivity disorder (ADHD), unspecified ADHD type Refills.  Take weekends off to use on workdays. - amphetamine-dextroamphetamine (ADDERALL) 15 MG tablet; Take 1 tablet by mouth 2 (two) times daily.  Dispense: 60 tablet; Refill: 0 - amphetamine-dextroamphetamine (ADDERALL) 15 MG tablet; Take 1 tablet by mouth daily.  Dispense: 60 tablet; Refill: 0  3. Need for influenza vaccination  - Flu Vaccine QUAD 28mo+IM (Fluarix, Fluzone & Alfiuria Quad PF)  4. Need for Tdap vaccination  - Tdap vaccine greater than or equal to 7yo IM   Return in about 3 months (around 08/04/2021).      I, Megan Mans, MD, have reviewed all documentation for this visit. The documentation on 05/10/21 for the exam, diagnosis, procedures, and orders are all accurate and complete.    Dereona Kolodny Wendelyn Breslow, MD  [facility] [phone] (phone) [phone] (fax)  [facility]

## 2021-05-04 NOTE — Patient Instructions (Addendum)
-  STOP CITALOPRAM

## 2021-07-07 ENCOUNTER — Other Ambulatory Visit: Payer: Self-pay

## 2021-07-07 ENCOUNTER — Encounter: Payer: Self-pay | Admitting: Family Medicine

## 2021-07-07 ENCOUNTER — Ambulatory Visit (INDEPENDENT_AMBULATORY_CARE_PROVIDER_SITE_OTHER): Payer: 59 | Admitting: Family Medicine

## 2021-07-07 VITALS — BP 112/71 | HR 55 | Temp 97.7°F | Ht 65.0 in | Wt 172.0 lb

## 2021-07-07 DIAGNOSIS — E7800 Pure hypercholesterolemia, unspecified: Secondary | ICD-10-CM

## 2021-07-07 DIAGNOSIS — F1011 Alcohol abuse, in remission: Secondary | ICD-10-CM

## 2021-07-07 DIAGNOSIS — Z1159 Encounter for screening for other viral diseases: Secondary | ICD-10-CM | POA: Insufficient documentation

## 2021-07-07 DIAGNOSIS — Z0000 Encounter for general adult medical examination without abnormal findings: Secondary | ICD-10-CM | POA: Insufficient documentation

## 2021-07-07 DIAGNOSIS — F411 Generalized anxiety disorder: Secondary | ICD-10-CM | POA: Diagnosis not present

## 2021-07-07 DIAGNOSIS — G4733 Obstructive sleep apnea (adult) (pediatric): Secondary | ICD-10-CM

## 2021-07-07 DIAGNOSIS — F909 Attention-deficit hyperactivity disorder, unspecified type: Secondary | ICD-10-CM | POA: Diagnosis not present

## 2021-07-07 DIAGNOSIS — Z114 Encounter for screening for human immunodeficiency virus [HIV]: Secondary | ICD-10-CM

## 2021-07-07 DIAGNOSIS — R739 Hyperglycemia, unspecified: Secondary | ICD-10-CM | POA: Insufficient documentation

## 2021-07-07 DIAGNOSIS — E876 Hypokalemia: Secondary | ICD-10-CM | POA: Insufficient documentation

## 2021-07-07 MED ORDER — AMPHETAMINE-DEXTROAMPHETAMINE 15 MG PO TABS: 15.0000 mg | ORAL_TABLET | Freq: Every day | ORAL | 0 refills | Status: AC

## 2021-07-07 NOTE — Assessment & Plan Note (Signed)
Chronic stable Takes drug holidays Has notices some difficulties with meal/weight loss d/t anorexia

## 2021-07-07 NOTE — Assessment & Plan Note (Signed)
Lost dental coverage  Due for eye exam Things to do to keep yourself healthy  - Exercise at least 30-45 minutes a day, 3-4 days a week.  - Eat a low-fat diet with lots of fruits and vegetables, up to 7-9 servings per day.  - Seatbelts can save your life. Wear them always.  - Smoke detectors on every level of your home, check batteries every year.  - Eye Doctor - have an eye exam every 1-2 years  - Safe sex - if you may be exposed to STDs, use a condom.  - Alcohol -  If you drink, do it moderately, less than 2 drinks per day.  - Health Care Power of Attorney. Choose someone to speak for you if you are not able.  - Depression is common in our stressful world.If you're feeling down or losing interest in things you normally enjoy, please come in for a visit.  - Violence - If anyone is threatening or hurting you, please call immediately.

## 2021-07-07 NOTE — Assessment & Plan Note (Signed)
Repeat serum chemistry Continue to recommend balanced, lower carb meals. Smaller meal size, adding snacks. Choosing water as drink of choice and increasing purposeful exercise.

## 2021-07-07 NOTE — Assessment & Plan Note (Signed)
Stable on new medication; has refills Tried counseling- feels that he does not have time

## 2021-07-07 NOTE — Assessment & Plan Note (Signed)
Repeat lipid level recommend diet low in saturated fat and regular exercise - 30 min at least 5 times per week

## 2021-07-07 NOTE — Assessment & Plan Note (Signed)
Low risk screeing

## 2021-07-07 NOTE — Assessment & Plan Note (Signed)
Sober today Remote hx of ETOH abuse

## 2021-07-07 NOTE — Assessment & Plan Note (Signed)
Denies cramps/palpitations Repeat chemistry

## 2021-07-07 NOTE — Assessment & Plan Note (Signed)
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

## 2021-07-07 NOTE — Progress Notes (Signed)
Complete physical exam   Patient: Ryan Dudley   DOB: 07-09-1981   40 y.o. Male  MRN: [id number] Visit Date: 07/07/2021  Today's healthcare provider: Gwyneth Sprout, FNP   No chief complaint on file.  Subjective    Ryan Dudley is a 40 y.o. male who presents today for a complete physical exam.  He reports consuming a general diet. The patient does not participate in regular exercise at present. He generally feels well. He reports sleeping fairly well. He does not have additional problems to discuss today.  HPI    History reviewed. No pertinent past medical history. Past Surgical History:  Procedure Laterality Date   MOUTH SURGERY     Social History   Socioeconomic History   Marital status: Married    Spouse name: Not on file   Number of children: Not on file   Years of education: Not on file   Highest education level: Not on file  Occupational History   Not on file  Tobacco Use   Smoking status: Former    Packs/day: 0.75    Types: Cigarettes    Quit date: 05/19/2017    Years since quitting: 4.1   Smokeless tobacco: Former  Scientific laboratory technician Use: Never used  Substance and Sexual Activity   Alcohol use: No   Drug use: No   Sexual activity: Yes    Partners: Female  Other Topics Concern   Not on file  Social History Narrative   Not on file   Social Determinants of Health   Financial Resource Strain: Not on file  Food Insecurity: Not on file  Transportation Needs: Not on file  Physical Activity: Not on file  Stress: Not on file  Social Connections: Not on file  Intimate Partner Violence: Not on file   Family Status  Relation Name Status   Mother  Deceased       Mid 62's   Family History  Adopted: Yes  Problem Relation Age of Onset   Pneumonia Mother    Allergies  Allergen Reactions   Ceclor [Cefaclor]     States he was administered this while hospitalized without reaction.   Penicillins     Reaction(s): Unknown     Patient Care  Team: Gwyneth Sprout, FNP as PCP - General (Family Medicine)   Medications: Outpatient Medications Prior to Visit  Medication Sig   hydrOXYzine (ATARAX/VISTARIL) 25 MG tablet Take 1 tablet (25 mg total) by mouth 3 (three) times daily as needed.   sertraline (ZOLOFT) 50 MG tablet Take 1 tablet (50 mg total) by mouth daily.   [DISCONTINUED] amphetamine-dextroamphetamine (ADDERALL) 15 MG tablet Take 1 tablet by mouth daily.   [DISCONTINUED] amphetamine-dextroamphetamine (ADDERALL) 15 MG tablet Take 1 tablet by mouth 2 (two) times daily.   [DISCONTINUED] diazepam (VALIUM) 10 MG tablet 1 tab po 30 min prior to procedure (Patient not taking: Reported on 07/07/2021)   [DISCONTINUED] HYDROcodone-acetaminophen (NORCO/VICODIN) 5-325 MG tablet Take 1 tablet by mouth every 6 (six) hours as needed for moderate pain. (Patient not taking: Reported on 07/07/2021)   No facility-administered medications prior to visit.    Review of Systems  Constitutional:  Positive for fatigue. Negative for activity change, appetite change, chills, diaphoresis, fever and unexpected weight change.  HENT: Negative.    Eyes: Negative.   Respiratory:  Positive for apnea. Negative for cough, choking, chest tightness, shortness of breath, wheezing and stridor.   Cardiovascular: Negative.   Gastrointestinal: Negative.  Endocrine: Negative.   Genitourinary: Negative.   Musculoskeletal: Negative.   Skin: Negative.   Allergic/Immunologic: Negative.   Neurological: Negative.   Hematological: Negative.   Psychiatric/Behavioral:  Positive for dysphoric mood. Negative for behavioral problems, confusion, decreased concentration, hallucinations, self-injury, sleep disturbance and suicidal ideas. The patient is not nervous/anxious and is not hyperactive.      Objective    BP 112/71 (BP Location: Right Arm, Patient Position: Sitting, Cuff Size: Large)    Pulse (!) 55    Temp 97.7 F (36.5 C) (Oral)    Ht _0  (1.651 m)    Wt 172  lb (78 kg)    SpO2 100%    BMI 28.62 kg/m    Physical Exam Vitals and nursing note reviewed.  Constitutional:      General: He is awake. He is not in acute distress.    Appearance: Normal appearance. He is well-developed, well-groomed and overweight. He is not ill-appearing, toxic-appearing or diaphoretic.  HENT:     Head: Normocephalic and atraumatic.     Jaw: There is normal jaw occlusion. No trismus, tenderness, swelling or pain on movement.     Salivary Glands: Right salivary gland is not diffusely enlarged or tender. Left salivary gland is not diffusely enlarged or tender.     Right Ear: Hearing, tympanic membrane, ear canal and external ear normal. There is no impacted cerumen.     Left Ear: Hearing, tympanic membrane, ear canal and external ear normal. There is no impacted cerumen.     Nose: Nose normal. No congestion or rhinorrhea.     Right Turbinates: Not enlarged, swollen or pale.     Left Turbinates: Not enlarged, swollen or pale.     Right Sinus: No maxillary sinus tenderness or frontal sinus tenderness.     Left Sinus: No maxillary sinus tenderness or frontal sinus tenderness.     Mouth/Throat:     Lips: Pink.     Mouth: Mucous membranes are moist. No injury, lacerations, oral lesions or angioedema.     Pharynx: Oropharynx is clear. Uvula midline. No pharyngeal swelling, oropharyngeal exudate or posterior oropharyngeal erythema.     Tonsils: No tonsillar exudate or tonsillar abscesses.  Eyes:     General: Lids are normal. Vision grossly intact. Gaze aligned appropriately.        Right eye: No discharge.        Left eye: No discharge.     Extraocular Movements: Extraocular movements intact.     Conjunctiva/sclera: Conjunctivae normal.     Pupils: Pupils are equal, round, and reactive to light.  Neck:     Thyroid: No thyroid mass, thyromegaly or thyroid tenderness.     Vascular: No carotid bruit.     Trachea: Trachea normal. No tracheal tenderness.  Cardiovascular:      Rate and Rhythm: Normal rate and regular rhythm.     Pulses: Normal pulses.          Carotid pulses are 2+ on the right side and 2+ on the left side.      Radial pulses are 2+ on the right side and 2+ on the left side.       Femoral pulses are 2+ on the right side and 2+ on the left side.      Popliteal pulses are 2+ on the right side and 2+ on the left side.       Dorsalis pedis pulses are 2+ on the right side and 2+ on the left side.  Posterior tibial pulses are 2+ on the right side and 2+ on the left side.     Heart sounds: Normal heart sounds, S1 normal and S2 normal. No murmur heard.   No friction rub. No gallop.  Pulmonary:     Effort: Pulmonary effort is normal. No respiratory distress.     Breath sounds: Normal breath sounds and air entry. No stridor. No wheezing, rhonchi or rales.  Chest:     Chest wall: No tenderness.  Abdominal:     General: Abdomen is flat. Bowel sounds are normal. There is no distension.     Palpations: Abdomen is soft. There is no mass.     Tenderness: There is no abdominal tenderness. There is no guarding or rebound.     Hernia: No hernia is present.  Genitourinary:    Comments: Exam deferred; denies complaints Musculoskeletal:        General: No swelling, tenderness, deformity or signs of injury. Normal range of motion.     Cervical back: Normal range of motion and neck supple. No rigidity or tenderness.     Right lower leg: No edema.     Left lower leg: No edema.  Lymphadenopathy:     Cervical: No cervical adenopathy.     Right cervical: No superficial, deep or posterior cervical adenopathy.    Left cervical: No superficial, deep or posterior cervical adenopathy.  Skin:    General: Skin is warm and dry.     Capillary Refill: Capillary refill takes less than 2 seconds.     Coloration: Skin is not jaundiced or pale.     Findings: No bruising, erythema, lesion or rash.  Neurological:     General: No focal deficit present.     Mental Status:  He is alert and oriented to person, place, and time. Mental status is at baseline.     GCS: GCS eye subscore is 4. GCS verbal subscore is 5. GCS motor subscore is 6.     Sensory: Sensation is intact. No sensory deficit.     Motor: Motor function is intact. No weakness.     Coordination: Coordination is intact.     Gait: Gait is intact.  Psychiatric:        Attention and Perception: Attention and perception normal.        Mood and Affect: Mood and affect normal.        Speech: Speech normal.        Behavior: Behavior normal. Behavior is cooperative.        Thought Content: Thought content normal.        Cognition and Memory: Cognition normal.        Judgment: Judgment normal.     Last depression screening scores PHQ 2/9 Scores 07/07/2021 05/04/2021 05/21/2020  PHQ - 2 Score _0 PHQ- 9 Score _1 Last fall risk screening Fall Risk  07/07/2021  Falls in the past year? 0  Number falls in past yr: 0  Injury with Fall? 0  Risk for fall due to : No Fall Risks  Follow up Falls evaluation completed   Last Audit-C alcohol use screening Alcohol Use Disorder Test (AUDIT) 07/07/2021  1. How often do you have a drink containing alcohol? 0  2. How many drinks containing alcohol do you have on a typical day when you are drinking? 0  3. How often do you have six or more drinks on one occasion? 0  AUDIT-C Score 0  A score of 3 or more in women, and 4 or more in men indicates increased risk for alcohol abuse, EXCEPT if all of the points are from question 1   No results found for any visits on 07/07/21.  Assessment & Plan    Routine Health Maintenance and Physical Exam  Exercise Activities and Dietary recommendations  Goals   None     Immunization History  Administered Date(s) Administered   Anthrax 09/09/2010   Hepatitis A, Adult 08/26/2009, 01/23/2010, 07/29/2010   IPV 08/26/2009   Influenza Inj Mdck Quad Pf 04/06/2018   Influenza,inj,Quad PF,6+ Mos 05/04/2021   Janssen  (J&J) SARS-COV-2 Vaccination 01/26/2020   MMR 08/26/2009, 01/23/2010   Meningococcal Conjugate 08/26/2009   Meningococcal Mcv4o 01/28/2016   Smallpox 01/25/2011, 01/28/2011   Tdap 08/26/2009, 05/04/2021   Typhoid Inactivated 05/27/2010, 01/24/2014, 01/28/2016   Yellow Fever 01/24/2014    Health Maintenance  Topic Date Due   Hepatitis C Screening  Never done   COVID-19 Vaccine (2 - Booster for Janssen series) 03/22/2020   TETANUS/TDAP  05/05/2031   INFLUENZA VACCINE  Completed   HIV Screening  Completed   Pneumococcal Vaccine 20-64 Years old  Aged Out   HPV VACCINES  Aged Out    Discussed health benefits of physical activity, and encouraged him to engage in regular exercise appropriate for his age and condition.  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  - Eat a low-fat diet with lots of fruits and vegetables, up to 7-9 servings per day.  - Seatbelts can save your life. Wear them always.  - Smoke detectors on every level of your home, check batteries every year.  - Eye Doctor - have an eye exam every 1-2 years  - Safe sex - if you may be exposed to STDs, use a condom.  - Alcohol -  If you drink, do it moderately, less than 2 drinks per day.  - [place]. Choose someone to speak for you if you are not able.  - Depression is common in our stressful world.If you're feeling down or losing interest in things you normally enjoy, please come in for a visit.  - Violence - If anyone is threatening or  hurting you, please call immediately.        Attention deficit hyperactivity disorder (ADHD)    Chronic stable Takes drug holidays Has notices some difficulties with meal/weight loss d/t anorexia       Relevant Medications   amphetamine-dextroamphetamine (ADDERALL) 15 MG tablet   Encounter for screening for human immunodeficiency virus (HIV)    Low risk screeing      Relevant Orders   HIV antibody (with reflex)   Encounter for hepatitis C screening test for low risk patient    Low risk screening      Relevant Orders   Hepatitis C Antibody   Low serum potassium  Denies cramps/palpitations Repeat chemistry      Relevant Orders   Comprehensive metabolic panel   Elevated serum glucose    Repeat serum chemistry Continue to recommend balanced, lower carb meals. Smaller meal size, adding snacks. Choosing water as drink of choice and increasing purposeful exercise.       Relevant Orders   Comprehensive metabolic panel   Hemoglobin A1c   Elevated LDL cholesterol level    Repeat lipid level recommend diet low in saturated fat and regular exercise - 30 min at least 5 times per week       Relevant Orders   Lipid panel   Alcohol abuse, in remission    Sober today Remote hx of ETOH abuse         Return in about 1 year (around 07/07/2022) for annual examination, chonic disease management.     Vonna Kotyk, FNP, have reviewed all documentation for this visit. The documentation on 07/07/21 for the exam, diagnosis, procedures, and orders are all accurate and complete.    Gwyneth Sprout, [place] [phone] (phone) [phone] (fax)  [place]

## 2021-07-07 NOTE — Assessment & Plan Note (Signed)
Low risk screening °

## 2021-07-08 LAB — HEMOGLOBIN A1C
Est. average glucose Bld gHb Est-mCnc: 108 mg/dL
Hgb A1c MFr Bld: 5.4 % (ref 4.8–5.6)

## 2021-07-08 LAB — COMPREHENSIVE METABOLIC PANEL
ALT: 29 IU/L (ref 0–44)
AST: 33 IU/L (ref 0–40)
Albumin/Globulin Ratio: 2.8 — ABNORMAL HIGH (ref 1.2–2.2)
Albumin: 4.4 g/dL (ref 4.0–5.0)
Alkaline Phosphatase: 79 IU/L (ref 44–121)
BUN/Creatinine Ratio: 10 (ref 9–20)
BUN: 9 mg/dL (ref 6–20)
Bilirubin Total: 0.5 mg/dL (ref 0.0–1.2)
CO2: 24 mmol/L (ref 20–29)
Calcium: 9 mg/dL (ref 8.7–10.2)
Chloride: 105 mmol/L (ref 96–106)
Creatinine, Ser: 0.86 mg/dL (ref 0.76–1.27)
Globulin, Total: 1.6 g/dL (ref 1.5–4.5)
Glucose: 83 mg/dL (ref 70–99)
Potassium: 4 mmol/L (ref 3.5–5.2)
Sodium: 142 mmol/L (ref 134–144)
Total Protein: 6 g/dL (ref 6.0–8.5)
eGFR: 114 mL/min/{1.73_m2} (ref >59)

## 2021-07-08 LAB — LIPID PANEL
Chol/HDL Ratio: 2.3 ratio (ref 0.0–5.0)
Cholesterol, Total: 157 mg/dL (ref 100–199)
HDL: 69 mg/dL (ref >39)
LDL Chol Calc (NIH): 63 mg/dL (ref 0–99)
Triglycerides: 146 mg/dL (ref 0–149)
VLDL Cholesterol Cal: 25 mg/dL (ref 5–40)

## 2021-07-08 LAB — HIV ANTIBODY (ROUTINE TESTING W REFLEX): HIV Screen 4th Generation wRfx: NONREACTIVE

## 2021-07-08 LAB — HEPATITIS C ANTIBODY: Hep C Virus Ab: <0.1 s/co ratio (ref 0.0–0.9)

## 2021-08-28 ENCOUNTER — Telehealth: Payer: Self-pay | Admitting: Urology

## 2021-11-04 NOTE — Progress Notes (Signed)
?  ? ?I,Sha'taria Tyson,acting as a Education administrator for [employer], PA-C.,have documented all relevant documentation on the behalf of Mikey Kirschner, PA-C,as directed by  Mikey Kirschner, PA-C while in the presence of Mikey Kirschner, PA-C.  ? ?Established patient visit ? ? ?Patient: Ryan Dudley   DOB: 1981-01-12   41 y.o. Male  MRN: [id number] ?Visit Date: 11/05/2021 ? ?Today's healthcare provider: Mikey Kirschner, PA-C  ? ?Cc. Unexpected weight loss, right knee pain ? ?Subjective  ?  ?HPI  ?Ryan Dudley is a 41y/o  male who presents today with unexpected weight loss of 9 pounds in 3 days. He reports he seems to be continuously losing weight without trying. Reports some fatigue. Admits to high levels of anxiety of the past two years with work/home, he is in the process of moving his family to [place]. He recently stopped his sertraline and focused on using Delta 8/9 for anxiety relief, but found it didn't help and wants to start back on the sertraline. Denies heart palpitations, chest pain, nausea, change in BM, bloody BM, headaches, dizziness.  ? ?He was using Adderall for ADHD, but disliked the side effects, especially as it is an appetite suppressant and he is losing weight. Would like to try other options. ? ?He reports right knee pain after working out 2 weeks ago. States it swelled up and was painful, unable to fully squat (all the way to the floor) with it. Reports swelling improved after a few days and pain is improving. H/o unspecified R knee surgery years ago. Denies instability, falls.  ? ?He reports he is adopted and does not know anything about his family history. He is curious about genetic testing.  ? ? ? ?  11/05/2021  ?  2:01 PM 03/19/2020  ?  8:05 AM 06/06/2019  ?  9:51 AM 02/16/2019  ?  9:19 AM  ?GAD 7 : Generalized Anxiety Score  ?Nervous, Anxious, on Edge _0 ?Control/stop worrying _1 ?Worry too much - different things _2 ?Trouble relaxing _3 ?Restless 3 3 0 0  ?Easily annoyed or  irritable _4 ?Afraid - awful might happen _5 ?Total GAD 7 Score _6 ?Anxiety Difficulty Somewhat difficult Very difficult Very difficult Somewhat difficult  ? ? ?Medications: ?Outpatient Medications Prior to Visit  ?Medication Sig  ? amphetamine-dextroamphetamine (ADDERALL) 15 MG tablet Take 1 tablet by mouth daily.  ? hydrOXYzine (ATARAX/VISTARIL) 25 MG tablet Take 1 tablet (25 mg total) by mouth 3 (three) times daily as needed.  ? [DISCONTINUED] sertraline (ZOLOFT) 50 MG tablet Take 1 tablet (50 mg total) by mouth daily.  ? ?No facility-administered medications prior to visit.  ? ? ?Review of Systems  ?Constitutional:  Positive for fatigue and unexpected weight change. Negative for fever.  ?Respiratory:  Negative for cough and shortness of breath.   ?Cardiovascular:  Negative for chest pain, palpitations and leg swelling.  ?Musculoskeletal:  Positive for arthralgias.  ?Neurological:  Negative for dizziness and headaches.  ? ? ?  Objective  ?  ?Blood pressure 114/78, pulse (!) 57, height _7  (1.727 m), weight 162 lb (73.5 kg), SpO2 98 %.  ? ?Physical Exam ?Constitutional:   ?   General: He is awake.  ?   Appearance: He is well-developed.  ?HENT:  ?   Head: Normocephalic.  ?Eyes:  ?   Conjunctiva/sclera: Conjunctivae normal.  ?Cardiovascular:  ?  Rate and Rhythm: Normal rate and regular rhythm.  ?   Heart sounds: Normal heart sounds.  ?Pulmonary:  ?   Effort: Pulmonary effort is normal.  ?   Breath sounds: Normal breath sounds.  ?Musculoskeletal:  ?   Right lower leg: No edema.  ?   Left lower leg: No edema.  ?Skin: ?   General: Skin is warm.  ?Neurological:  ?   Mental Status: He is alert and oriented to person, place, and time.  ?Psychiatric:     ?   Attention and Perception: Attention normal.     ?   Mood and Affect: Mood normal.     ?   Speech: Speech normal.     ?   Behavior: Behavior is cooperative.  ?  ? ?Results for orders placed or performed in visit on 11/05/21  ?CBC w/Diff/Platelet   ?Result Value Ref Range  ? WBC 6.2 3.4 - 10.8 x10E3/uL  ? RBC 4.77 4.14 - 5.80 x10E6/uL  ? Hemoglobin 14.5 13.0 - 17.7 g/dL  ? Hematocrit 41.8 37.5 - 51.0 %  ? MCV 88 79 - 97 fL  ? MCH 30.4 26.6 - 33.0 pg  ? MCHC 34.7 31.5 - 35.7 g/dL  ? RDW 12.4 11.6 - 15.4 %  ? Platelets 226 150 - 450 x10E3/uL  ? Neutrophils 63 Not Estab. %  ? Lymphs 26 Not Estab. %  ? Monocytes 8 Not Estab. %  ? Eos 2 Not Estab. %  ? Basos 1 Not Estab. %  ? Neutrophils Absolute 4.0 1.4 - 7.0 x10E3/uL  ? Lymphocytes Absolute 1.6 0.7 - 3.1 x10E3/uL  ? Monocytes Absolute 0.5 0.1 - 0.9 x10E3/uL  ? EOS (ABSOLUTE) 0.1 0.0 - 0.4 x10E3/uL  ? Basophils Absolute 0.0 0.0 - 0.2 x10E3/uL  ? Immature Granulocytes 0 Not Estab. %  ? Immature Grans (Abs) 0.0 0.0 - 0.1 x10E3/uL  ?TSH + free T4  ?Result Value Ref Range  ? TSH 1.360 0.450 - 4.500 uIU/mL  ? Free T4 1.54 0.82 - 1.77 ng/dL  ?Comprehensive Metabolic Panel (CMET)  ?Result Value Ref Range  ? Glucose 91 70 - 99 mg/dL  ? BUN 15 6 - 20 mg/dL  ? Creatinine, Ser 0.90 0.76 - 1.27 mg/dL  ? eGFR 111 >59 mL/min/1.73  ? BUN/Creatinine Ratio 17 9 - 20  ? Sodium 144 134 - 144 mmol/L  ? Potassium 4.0 3.5 - 5.2 mmol/L  ? Chloride 105 96 - 106 mmol/L  ? CO2 25 20 - 29 mmol/L  ? Calcium 9.3 8.7 - 10.2 mg/dL  ? Total Protein 6.4 6.0 - 8.5 g/dL  ? Albumin 4.7 4.0 - 5.0 g/dL  ? Globulin, Total 1.7 1.5 - 4.5 g/dL  ? Albumin/Globulin Ratio 2.8 (H) 1.2 - 2.2  ? Bilirubin Total 0.6 0.0 - 1.2 mg/dL  ? Alkaline Phosphatase 70 44 - 121 IU/L  ? AST 27 0 - 40 IU/L  ? ALT 16 0 - 44 IU/L  ? ? Assessment & Plan  ?  ? ?Problem List Items Addressed This Visit   ? ?  ? Other  ? GAD (generalized anxiety disorder)  ?  Strongly recommending restarting Sertraline 50 mg, discussed finding a therapist.  ? ? ?  ?  ? Relevant Medications  ? sertraline (ZOLOFT) 50 MG tablet  ? Attention deficit hyperactivity disorder (ADHD)  ?  We can try Strattera, 40 mg daily to start, can increase to 80 mg if needed. ? ? ?  ?  ?  Relevant Medications  ?  atomoxetine (STRATTERA) 40 MG capsule  ? Weight loss, unintentional - Primary  ?  Unclear etiology, may be secondary to ongoing anxiety/stress. ?Start back on sertraline 50 mg. ?Will order cmp/cbc/tsh/t4 ? ?  ?  ? Relevant Orders  ? CBC w/Diff/Platelet (Completed)  ? TSH + free T4 (Completed)  ? Comprehensive Metabolic Panel (CMET) (Completed)  ? Acute pain of right knee  ?  For now, recommending conservative treatment of ice/heat, elevation, stretching. ? ? ?  ?  ? Adopted person  ?  Refer to genetic counselor as pt is curious to possible genetic cancer family history. ? ? ?  ?  ? Relevant Orders  ? Ambulatory referral to Genetics  ?  ? ?Return in about 6 weeks (around 12/17/2021) for anxiety, adhd.  ?   ? ?I, Mikey Kirschner, PA-C have reviewed all documentation for this visit. The documentation on  11/06/2021  for the exam, diagnosis, procedures, and orders are all accurate and complete. ? ?Mikey Kirschner, PA-C ?[facility] ?[id number] ?[place], [place], [address] ?Office: [phone] ?Fax: [phone]  ? ?[facility] ? ?

## 2021-11-05 ENCOUNTER — Ambulatory Visit (INDEPENDENT_AMBULATORY_CARE_PROVIDER_SITE_OTHER): Payer: Self-pay | Admitting: Physician Assistant

## 2021-11-05 ENCOUNTER — Encounter: Payer: Self-pay | Admitting: Physician Assistant

## 2021-11-05 VITALS — BP 114/78 | HR 57 | Ht 68.0 in | Wt 162.0 lb

## 2021-11-05 DIAGNOSIS — F411 Generalized anxiety disorder: Secondary | ICD-10-CM

## 2021-11-05 DIAGNOSIS — R634 Abnormal weight loss: Secondary | ICD-10-CM | POA: Diagnosis not present

## 2021-11-05 DIAGNOSIS — M25561 Pain in right knee: Secondary | ICD-10-CM

## 2021-11-05 DIAGNOSIS — F909 Attention-deficit hyperactivity disorder, unspecified type: Secondary | ICD-10-CM

## 2021-11-05 DIAGNOSIS — Z789 Other specified health status: Secondary | ICD-10-CM

## 2021-11-05 DIAGNOSIS — Z0282 Encounter for adoption services: Secondary | ICD-10-CM

## 2021-11-05 MED ORDER — ATOMOXETINE HCL 40 MG PO CAPS: 40.0000 mg | ORAL_CAPSULE | Freq: Every day | ORAL | 1 refills | Status: AC

## 2021-11-05 MED ORDER — SERTRALINE HCL 50 MG PO TABS: 50.0000 mg | ORAL_TABLET | Freq: Every day | ORAL | 3 refills | Status: AC

## 2021-11-06 ENCOUNTER — Encounter: Payer: Self-pay | Admitting: Physician Assistant

## 2021-11-06 DIAGNOSIS — M25561 Pain in right knee: Secondary | ICD-10-CM | POA: Insufficient documentation

## 2021-11-06 DIAGNOSIS — R634 Abnormal weight loss: Secondary | ICD-10-CM | POA: Insufficient documentation

## 2021-11-06 DIAGNOSIS — Z0282 Encounter for adoption services: Secondary | ICD-10-CM | POA: Insufficient documentation

## 2021-11-06 LAB — CBC WITH DIFFERENTIAL/PLATELET
Basophils Absolute: 0 10*3/uL (ref 0.0–0.2)
Basos: 1 %
EOS (ABSOLUTE): 0.1 10*3/uL (ref 0.0–0.4)
Eos: 2 %
Hematocrit: 41.8 % (ref 37.5–51.0)
Hemoglobin: 14.5 g/dL (ref 13.0–17.7)
Immature Grans (Abs): 0 10*3/uL (ref 0.0–0.1)
Immature Granulocytes: 0 %
Lymphocytes Absolute: 1.6 10*3/uL (ref 0.7–3.1)
Lymphs: 26 %
MCH: 30.4 pg (ref 26.6–33.0)
MCHC: 34.7 g/dL (ref 31.5–35.7)
MCV: 88 fL (ref 79–97)
Monocytes Absolute: 0.5 10*3/uL (ref 0.1–0.9)
Monocytes: 8 %
Neutrophils Absolute: 4 10*3/uL (ref 1.4–7.0)
Neutrophils: 63 %
Platelets: 226 10*3/uL (ref 150–450)
RBC: 4.77 x10E6/uL (ref 4.14–5.80)
RDW: 12.4 % (ref 11.6–15.4)
WBC: 6.2 10*3/uL (ref 3.4–10.8)

## 2021-11-06 LAB — TSH+FREE T4
Free T4: 1.54 ng/dL (ref 0.82–1.77)
TSH: 1.36 u[IU]/mL (ref 0.450–4.500)

## 2021-11-06 LAB — COMPREHENSIVE METABOLIC PANEL
ALT: 16 IU/L (ref 0–44)
AST: 27 IU/L (ref 0–40)
Albumin/Globulin Ratio: 2.8 — ABNORMAL HIGH (ref 1.2–2.2)
Albumin: 4.7 g/dL (ref 4.0–5.0)
Alkaline Phosphatase: 70 IU/L (ref 44–121)
BUN/Creatinine Ratio: 17 (ref 9–20)
BUN: 15 mg/dL (ref 6–20)
Bilirubin Total: 0.6 mg/dL (ref 0.0–1.2)
CO2: 25 mmol/L (ref 20–29)
Calcium: 9.3 mg/dL (ref 8.7–10.2)
Chloride: 105 mmol/L (ref 96–106)
Creatinine, Ser: 0.9 mg/dL (ref 0.76–1.27)
Globulin, Total: 1.7 g/dL (ref 1.5–4.5)
Glucose: 91 mg/dL (ref 70–99)
Potassium: 4 mmol/L (ref 3.5–5.2)
Sodium: 144 mmol/L (ref 134–144)
Total Protein: 6.4 g/dL (ref 6.0–8.5)
eGFR: 111 mL/min/{1.73_m2} (ref >59)

## 2021-11-06 NOTE — Assessment & Plan Note (Signed)
For now, recommending conservative treatment of ice/heat, elevation, stretching. ? ?

## 2021-11-06 NOTE — Assessment & Plan Note (Signed)
Refer to genetic counselor as pt is curious to possible genetic cancer family history. ? ?

## 2021-11-06 NOTE — Assessment & Plan Note (Signed)
Strongly recommending restarting Sertraline 50 mg, discussed finding a therapist.  ? ?

## 2021-11-06 NOTE — Assessment & Plan Note (Signed)
We can try Strattera, 40 mg daily to start, can increase to 80 mg if needed. ? ?

## 2021-11-06 NOTE — Assessment & Plan Note (Signed)
Unclear etiology, may be secondary to ongoing anxiety/stress. ?Start back on sertraline 50 mg. ?Will order cmp/cbc/tsh/t4 ?

## 2021-11-10 ENCOUNTER — Other Ambulatory Visit: Payer: Self-pay | Admitting: Physician Assistant

## 2021-11-10 DIAGNOSIS — R778 Other specified abnormalities of plasma proteins: Secondary | ICD-10-CM

## 2021-11-18 ENCOUNTER — Inpatient Hospital Stay: Payer: Self-pay | Attending: Oncology | Admitting: Licensed Clinical Social Worker

## 2021-11-18 ENCOUNTER — Inpatient Hospital Stay: Payer: Self-pay

## 2021-11-18 DIAGNOSIS — Z789 Other specified health status: Secondary | ICD-10-CM

## 2021-11-18 NOTE — Progress Notes (Signed)
REFERRING PROVIDER: ?Ryan Kirschner, PA-C ?[address] ?[place],  [place] [address] ? ?PRIMARY PROVIDER:  ?Ryan Sprout, FNP ? ?PRIMARY REASON FOR VISIT:  ?1. Family history unknown   ? ? ? ?HISTORY OF PRESENT ILLNESS:   ?Ryan Dudley, a 41 y.o. male, was seen for a [facility] genetics consultation at the request of Dr. Thedore Dudley due to an unknown family history.  Ryan Dudley presents to clinic today to discuss the possibility of a hereditary predisposition to cancer, genetic testing, and to further clarify his future cancer risks, as well as potential cancer risks for family members.  ? ?Ryan Dudley is a 41 y.o. male with no personal history of cancer.   ? ?CANCER HISTORY:  ?Oncology History  ? No history exists.  ? ?FAMILY HISTORY:  ?We obtained a detailed, 4-generation family history.  Significant diagnoses are listed below: ?Family History  ?Adopted: Yes  ?Problem Relation Age of Onset  ? Pneumonia Mother   ? ?Ryan Dudley was adopted and has no information about biological relatives.  ? ?Ryan Dudley is unaware of previous family history of genetic testing for hereditary cancer risks. Patient's maternal ancestors are of unknown descent, and paternal ancestors are of unknown descent. There is no reported Ashkenazi Jewish ancestry. There is no known consanguinity. ? ?GENETIC COUNSELING ASSESSMENT: Ryan Dudley is a 41 y.o. male with an unknown family history. We, therefore, discussed and recommended the following at today's visit.  ? ?DISCUSSION: We discussed that approximately 10% of cancer is hereditary. Cancers and risks are gene specific.  He does not meet any specific criteria for testing for hereditary cancer conditions, but it is reasonable to consider testing since he does not know his family history. We discussed that testing is beneficial for several reasons including knowing about cancer risks, identifying potential screening and risk-reduction options that may be appropriate,  and to understand if other family members could be at risk for cancer and allow them to undergo genetic testing.  ? ?We reviewed the characteristics, features and inheritance patterns of hereditary cancer syndromes. We also discussed genetic testing, including the appropriate family members to test, the process of testing, insurance coverage and turn-around-time for results. We discussed the implications of a negative, positive and/or variant of uncertain significant result. We recommended Ryan Dudley pursue genetic testing for the [facility] CancerNext-Expanded+RNA gene panel.  ? ?Based on Ryan Dudley's unknown family history,  he does not meet medical criteria for genetic testing and he may have an out of pocket cost.  ? ?PLAN: After considering the risks, benefits, and limitations, Ryan Dudley provided informed consent to pursue genetic testing and the blood sample was sent to [facility] for analysis of the CancerNext-Expanded+RNA panel. Results should be available within approximately 2-3 weeks' time, at which point they will be disclosed by telephone to Ryan Dudley, as will any additional recommendations warranted by these results. Ryan Dudley will receive a summary of his genetic counseling visit and a copy of his results once available. This information will also be available in Epic.  ? ?[name]'s questions were answered to his satisfaction today. Our contact information was provided should additional questions or concerns arise. Thank you for the referral and allowing [place] to share in the care of your patient.  ? ?Ryan Rogue, MS, LCGC ?Genetic Counselor ?[email or web address] ?Phone: [phone] ? ?The patient was seen for a total of 20 minutes in face-to-face genetic counseling.  Ryan Dudley was available for discussion regarding this case.  ? ?_______________________________________________________________________ ?  For Office Staff:  ?Number of people involved in session:  1 ?Was an Intern/ student involved with case: no ? ?

## 2021-12-02 ENCOUNTER — Telehealth: Payer: Self-pay | Admitting: Licensed Clinical Social Worker

## 2021-12-07 ENCOUNTER — Encounter: Payer: Self-pay | Admitting: Licensed Clinical Social Worker

## 2021-12-07 ENCOUNTER — Ambulatory Visit: Payer: Self-pay | Admitting: Licensed Clinical Social Worker

## 2021-12-07 DIAGNOSIS — Z1379 Encounter for other screening for genetic and chromosomal anomalies: Secondary | ICD-10-CM | POA: Insufficient documentation

## 2021-12-07 NOTE — Progress Notes (Signed)
HPI:   Ryan Dudley was previously seen in the [facility] clinic due to an unknown family history and concerns regarding a hereditary predisposition to cancer. Please refer to our prior cancer genetics clinic note for more information regarding our discussion, assessment and recommendations, at the time. Ryan Dudley recent genetic test results were disclosed to him, as were recommendations warranted by these results. These results and recommendations are discussed in more detail below.  CANCER HISTORY:  Oncology History   No history exists.    FAMILY HISTORY:  We obtained a detailed, 4-generation family history.  Significant diagnoses are listed below: Family History  Adopted: Yes  Problem Relation Age of Onset   Pneumonia Mother     Ryan Dudley was adopted and has no information about biological relatives. He has 4 children.    Ryan Dudley is unaware of previous family history of genetic testing for hereditary cancer risks. Patient's maternal ancestors are of unknown descent, and paternal ancestors are of unknown descent. There is no reported Ashkenazi Jewish ancestry. There is no known consanguinity.    GENETIC TEST RESULTS:  The [facility] CancerNext-Expanded+RNA Panel found no pathogenic mutations.   The CancerNext-Expanded + RNAinsight gene panel offered by [facility] and includes sequencing and rearrangement analysis for the following 77 genes: IP, ALK, APC*, ATM*, AXIN2, BAP1, BARD1, BLM, BMPR1A, BRCA1*, BRCA2*, BRIP1*, CDC73, CDH1*,CDK4, CDKN1B, CDKN2A, CHEK2*, CTNNA1, DICER1, FANCC, FH, FLCN, GALNT12, KIF1B, LZTR1, MAX, MEN1, MET, MLH1*, MSH2*, MSH3, MSH6*, MUTYH*, NBN, NF1*, NF2, NTHL1, PALB2*, PHOX2B, PMS2*, POT1, PRKAR1A, PTCH1, PTEN*, RAD51C*, RAD51D*,RB1, RECQL, RET, SDHA, SDHAF2, SDHB, SDHC, SDHD, SMAD4, SMARCA4, SMARCB1, SMARCE1, STK11, SUFU, TMEM127, TP53*,TSC1, TSC2, VHL and XRCC2 (sequencing and deletion/duplication); EGFR, EGLN1, HOXB13, KIT, MITF,  PDGFRA, POLD1 and POLE (sequencing only); EPCAM and GREM1 (deletion/duplication only).   The test report has been scanned into EPIC and is located under the Molecular Pathology section of the Results Review tab.  A portion of the result report is included below for reference. Genetic testing reported out on 12/01/2021.      Even though a pathogenic variant was not identified, possible explanations for the cancer in the family may include: There may be no hereditary risk for cancer in the family.  There may be a gene mutation in one of these genes that current testing methods cannot detect but that chance is small. There could be another gene that has not yet been discovered, or that we have not yet tested, that is responsible for the cancer diagnoses in the family.  It is also possible there is a hereditary cause for the cancer in the family that Ryan Dudley did not inherit.  Therefore, it is important to remain in touch with cancer genetics in the future so that we can continue to offer Ryan Dudley the most up to date genetic testing.    ADDITIONAL GENETIC TESTING:  We discussed with Ryan Dudley that his genetic testing was fairly extensive.  If there are additional relevant genes identified to increase cancer risk that can be analyzed in the future, we would be happy to discuss and coordinate this testing at that time.    CANCER SCREENING RECOMMENDATIONS:  Ryan Dudley test result is considered negative (normal).    An individual's cancer risk and medical management are not determined by genetic test results alone. Overall cancer risk assessment incorporates additional factors, including personal medical history, family history, and any available genetic information that may result in a personalized plan for cancer prevention and  surveillance. Therefore, it is recommended he continue to follow the cancer management and screening guidelines provided by his primary healthcare  provider.  RECOMMENDATIONS FOR FAMILY MEMBERS:   Since he did not inherit a identifiable mutation in a cancer predisposition gene included on this panel, his children could not have inherited a known mutation from him in one of these genes. Individuals in this family might be at some increased risk of developing cancer, over the general population risk, due to the family history of cancer.  Individuals in the family should notify their providers of the family history of cancer. We recommend women in this family have a yearly mammogram beginning at age 79, or 79 years younger than the earliest onset of cancer, an annual clinical breast exam, and perform monthly breast self-exams.  Family members should have colonoscopies by at age 71, or earlier, as recommended by their providers.  FOLLOW-UP:  Lastly, we discussed with Ryan Dudley that cancer genetics is a rapidly advancing field and it is possible that new genetic tests will be appropriate for him and/or his family members in the future. We encouraged him to remain in contact with cancer genetics on an annual basis so we can update his personal and family histories and let him know of advances in cancer genetics that may benefit this family.   Our contact number was provided. Ryan Dudley questions were answered to his satisfaction, and he knows he is welcome to call us at anytime with additional questions or concerns.    Faith Rogue, MS, [facility] Genetic Counselor [email or web address] Phone: [phone]

## 2021-12-07 NOTE — Telephone Encounter (Signed)
Revealed negative genetic testing.  This normal result is reassuring.  It is unlikely that there is an increased risk of  cancer due to a mutation in one of these genes.  However, genetic testing is not perfect, and cannot definitively rule out a hereditary cause.  It will be important for him to keep in contact with genetics to learn if any additional testing may be needed in the future.
